# Patient Record
Sex: Female | Born: 1990 | Race: White | Hispanic: No | Marital: Single | Smoking: Never smoker
Health system: Southern US, Community
[De-identification: ages and names within clinical notes are randomized; demographics above are authoritative.]

## PROBLEM LIST (undated history)

## (undated) DIAGNOSIS — N83209 Unspecified ovarian cyst, unspecified side: Secondary | ICD-10-CM

## (undated) HISTORY — PX: NO PAST SURGERIES: SHX2092

## (undated) HISTORY — DX: Unspecified ovarian cyst, unspecified side: N83.209

---

## 2010-11-01 DIAGNOSIS — N83209 Unspecified ovarian cyst, unspecified side: Secondary | ICD-10-CM

## 2010-11-01 HISTORY — DX: Unspecified ovarian cyst, unspecified side: N83.209

## 2014-01-03 LAB — HM HIV SCREENING LAB: HM HIV Screening: NEGATIVE

## 2014-01-03 LAB — HM PAP SMEAR: HM Pap smear: NEGATIVE

## 2015-06-09 DIAGNOSIS — L309 Dermatitis, unspecified: Secondary | ICD-10-CM | POA: Insufficient documentation

## 2015-06-09 DIAGNOSIS — R17 Unspecified jaundice: Secondary | ICD-10-CM | POA: Insufficient documentation

## 2015-06-09 DIAGNOSIS — G43909 Migraine, unspecified, not intractable, without status migrainosus: Secondary | ICD-10-CM | POA: Insufficient documentation

## 2015-06-09 DIAGNOSIS — IMO0001 Reserved for inherently not codable concepts without codable children: Secondary | ICD-10-CM | POA: Insufficient documentation

## 2015-06-11 ENCOUNTER — Ambulatory Visit
Admission: RE | Admit: 2015-06-11 | Discharge: 2015-06-11 | Disposition: A | Discharge status: Home / Self Care | Payer: Managed Care, Other (non HMO) | Source: Ambulatory Visit | Attending: Physician Assistant | Admitting: Physician Assistant

## 2015-06-11 ENCOUNTER — Ambulatory Visit (INDEPENDENT_AMBULATORY_CARE_PROVIDER_SITE_OTHER): Payer: Managed Care, Other (non HMO) | Admitting: Physician Assistant

## 2015-06-11 ENCOUNTER — Encounter: Payer: Self-pay | Admitting: Physician Assistant

## 2015-06-11 VITALS — BP 88/60 | HR 92 | Temp 97.9°F | Resp 18 | Wt 111.8 lb

## 2015-06-11 DIAGNOSIS — R1030 Lower abdominal pain, unspecified: Secondary | ICD-10-CM | POA: Insufficient documentation

## 2015-06-11 LAB — POCT URINALYSIS DIPSTICK
BILIRUBIN UA: NEGATIVE
Glucose, UA: NEGATIVE
Ketones, UA: NEGATIVE
Leukocytes, UA: NEGATIVE
Nitrite, UA: NEGATIVE
PH UA: 6
Protein, UA: NEGATIVE
RBC UA: NEGATIVE
Spec Grav, UA: >=1.03
Urobilinogen, UA: 0.2

## 2015-06-11 LAB — POCT URINE PREGNANCY: Preg Test, Ur: NEGATIVE

## 2015-06-11 NOTE — Progress Notes (Signed)
Patient: Debbie Kidd Female    DOB: 1991-10-20   24 y.o.   MRN: [id number] Visit Date: 06/11/2015  Today's Provider: Margaretann Loveless, PA-C   Chief Complaint  Patient presents with  . Abdominal Pain   Subjective:    Abdominal Pain This is a new (for a month) problem. The current episode started more than 1 month ago. Onset quality: Only when urinating feels pressure. The problem occurs daily (Bladder sxs). The problem has been unchanged (started to feel better but for the last week has got ten worst.). The pain is located in the suprapubic region (Feels pain when has a bowel also). The pain is at a severity of 3/10. The pain is mild. The quality of the pain is sharp and dull (sharp pain is right around umbilicus). The abdominal pain radiates to the back. Associated symptoms include constipation, diarrhea and flatus. Pertinent negatives include no anorexia, arthralgias, belching, dysuria, fever, frequency, headaches, hematochezia, hematuria, melena, myalgias, nausea, vomiting or weight loss. The pain is aggravated by urination and bowel movement. The pain is relieved by nothing. She has tried nothing for the symptoms. There is no history of abdominal surgery, colon cancer, Crohn's disease, gallstones, GERD, irritable bowel syndrome, pancreatitis, PUD or ulcerative colitis.  Constipation This is a new problem. The current episode started in the past 7 days. The problem is unchanged. Her stool frequency is 1 time per day. The stool is described as pellet like. The patient is on a high fiber diet (whole grain foods). She does not exercise regularly. There has been adequate water intake (32 oz a day). Associated symptoms include abdominal pain, back pain, bloating, diarrhea, difficulty urinating (pressure in the bladder) and flatus. Pertinent negatives include no anorexia, fecal incontinence, fever, hematochezia, hemorrhoids, melena, nausea, rectal pain, vomiting or weight loss. She has tried  laxatives (Probiotics daily) for the symptoms. The treatment provided no relief. There is no history of abdominal surgery or irritable bowel syndrome.       Allergies  Allergen Reactions  . Other Hives    Other reaction(s): Eczema   Previous Medications   No medications on file    Review of Systems  Constitutional: Negative.  Negative for fever, chills and weight loss.  HENT: Negative.   Eyes: Negative.   Respiratory: Positive for cough (a little).   Cardiovascular: Negative.   Gastrointestinal: Positive for abdominal pain, diarrhea, constipation, bloating and flatus. Negative for nausea, vomiting, melena, hematochezia, rectal pain, anorexia and hemorrhoids.  Endocrine: Negative.   Genitourinary: Positive for vaginal discharge (light yellow) and difficulty urinating (pressure in the bladder). Negative for dysuria, urgency, frequency, hematuria and vaginal bleeding.  Musculoskeletal: Positive for back pain. Negative for myalgias and arthralgias.  Skin: Negative.   Allergic/Immunologic: Positive for environmental allergies.  Neurological: Negative.  Negative for headaches.  Hematological: Negative.   Psychiatric/Behavioral: Negative.     Social History  Substance Use Topics  . Smoking status: Never Smoker   . Smokeless tobacco: Never Used  . Alcohol Use: 0.0 oz/week    0 Standard drinks or equivalent per week     Comment: OCCASIONALLY   Objective:   There were no vitals taken for this visit.  Physical Exam  Constitutional: She appears well-developed and well-nourished. No distress.  Abdominal: Soft. Normal appearance. She exhibits no shifting dullness, no distension, no pulsatile liver, no fluid wave, no abdominal bruit, no ascites, no pulsatile midline mass and no mass. Bowel sounds are increased. There is no  hepatosplenomegaly. There is tenderness in the right lower quadrant, periumbilical area, suprapubic area and left lower quadrant. There is no rigidity, no rebound, no  guarding, no CVA tenderness, no tenderness at McBurney's point and negative Murphy's sign.  Skin: She is not diaphoretic.  Vitals reviewed.       Assessment & Plan:     1. Lower abdominal pain UA was negative for UTI and negative for pregnancy. I will have her get a x-ray of her abdomen and check a CBC for infectious cause versus constipation. If x-ray is positive for stool burden she will start MiraLAX daily. If x-ray is negative will consider follow-up and evaluation for possible uterine fibroids or ovarian cysts. We'll get a pelvic ultrasound and transvaginal ultrasound to work this up if x-ray is negative. She does see Dr. Valentino Saxon for her OB\GYN care. May consider follow-up with Dr. Valentino Saxon as well for the pain and further evaluation.  Per her UA, she will does seem to be dehydrated. I did discuss this with her and advised her to increase fluid intake as well.  - POCT Urinalysis Dipstick - CBC with Differential - DG Abd 2 Views; Future - POCT urine pregnancy       Margaretann Loveless, PA-C  [facility]

## 2015-06-11 NOTE — Patient Instructions (Signed)

## 2015-06-12 LAB — CBC WITH DIFFERENTIAL/PLATELET
BASOS: 1 %
Basophils Absolute: 0 10*3/uL (ref 0.0–0.2)
EOS (ABSOLUTE): 0.1 10*3/uL (ref 0.0–0.4)
EOS: 1 %
Hematocrit: 38.3 % (ref 34.0–46.6)
Hemoglobin: 12.5 g/dL (ref 11.1–15.9)
Immature Grans (Abs): 0 10*3/uL (ref 0.0–0.1)
Immature Granulocytes: 0 %
LYMPHS ABS: 3.3 10*3/uL — AB (ref 0.7–3.1)
Lymphs: 45 %
MCH: 30.6 pg (ref 26.6–33.0)
MCHC: 32.6 g/dL (ref 31.5–35.7)
MCV: 94 fL (ref 79–97)
Monocytes Absolute: 0.5 10*3/uL (ref 0.1–0.9)
Monocytes: 7 %
NEUTROS ABS: 3.4 10*3/uL (ref 1.4–7.0)
NEUTROS PCT: 46 %
PLATELETS: 269 10*3/uL (ref 150–379)
RBC: 4.09 x10E6/uL (ref 3.77–5.28)
RDW: 12.1 % — ABNORMAL LOW (ref 12.3–15.4)
WBC: 7.4 10*3/uL (ref 3.4–10.8)

## 2015-06-18 ENCOUNTER — Ambulatory Visit (INDEPENDENT_AMBULATORY_CARE_PROVIDER_SITE_OTHER): Payer: Managed Care, Other (non HMO) | Admitting: Obstetrics and Gynecology

## 2015-06-18 ENCOUNTER — Encounter: Payer: Self-pay | Admitting: Obstetrics and Gynecology

## 2015-06-18 VITALS — BP 101/61 | HR 72 | Resp 16 | Ht 64.0 in | Wt 111.6 lb

## 2015-06-18 DIAGNOSIS — N9489 Other specified conditions associated with female genital organs and menstrual cycle: Secondary | ICD-10-CM

## 2015-06-18 DIAGNOSIS — R102 Pelvic and perineal pain: Secondary | ICD-10-CM

## 2015-06-18 NOTE — Progress Notes (Signed)
GYNECOLOGY PROGRESS NOTE  Subjective:    Patient ID: Debbie Kidd, female    DOB: [birth year], 24 y.o.   MRN: [id number]  HPI  Patient is a 24 y.o. G0P0 female who presents as a referral from [facility] for pelvic pressure ongoing x 1 month.  Reports feeling a "heaviness and excess pressure near bladder and rectum" when going to void or have a BM.  Denies painful stools or urination, or pelvic pain. Had been dealing with constipation earlier last month, however this has resolved, but the pelvic pressure is still there.  Currently has a Mirena IUD in place (placed 12/2013).  Denies any issues with IUD except notes that most recent period was heavier than normal. Has not attempted to feel for strings.   PCP was concerned that patient may have fibroids, and was referred to GYN.  Patient had an abdominal X-ray ~ 3 weeks ago that noted large amounts of stool in colon, possible mild small bowel ilieus.     Past Medical History  Diagnosis Date  . Ovarian cyst 2012    No surgery needed   Past Surgical History  Procedure Laterality Date  . No past surgeries     Social History  Substance Use Topics  . Smoking status: Never Smoker   . Smokeless tobacco: Never Used  . Alcohol Use: 0.0 oz/week    0 Standard drinks or equivalent per week     Comment: OCCASIONALLY    Current Outpatient Prescriptions on File Prior to Visit  Medication Sig Dispense Refill  . levonorgestrel (MIRENA) 20 MCG/24HR IUD 1 each by Intrauterine route once.    . Probiotic Product (PROBIOTIC DAILY) CAPS Take 2 capsules by mouth daily.    Marland Kitchen triamcinolone cream (KENALOG) 0.5 % apply to affected area twice a day  0   No current facility-administered medications on file prior to visit.   Allergies  Allergen Reactions  . Other Hives    Other reaction(s): Eczema    Review of Systems Pertinent items are noted in HPI.   Objective:   Blood pressure 101/61, pulse 72, resp. rate 16, height 5\' 4"  (1.626 m), weight  111 lb 9.6 oz (50.621 kg), last menstrual period 05/16/2015. General appearance: alert and no distress Abdomen: soft, non-tender; bowel sounds normal; no masses,  no organomegaly Pelvic: cervix normal in appearance, external genitalia normal, no adnexal masses or tenderness, no cervical motion tenderness, rectovaginal septum normal, uterus normal size, shape, and consistency and vagina normal without discharge.  IUD strings visible.  Extremities: extremities normal, atraumatic, no cyanosis or edema Neurologic: Grossly normal   Labs:  Office Visit on 06/11/2015  Component Date Value Ref Range Status  . Color, UA 06/11/2015 Yellow   Final  . Clarity, UA 06/11/2015 Clear   Final  . Glucose, UA 06/11/2015 negative   Final  . Bilirubin, UA 06/11/2015 negative   Final  . Ketones, UA 06/11/2015 negative   Final  . Spec Grav, UA 06/11/2015 >=1.030   Final  . Blood, UA 06/11/2015 negative   Final  . pH, UA 06/11/2015 6.0   Final  . Protein, UA 06/11/2015 negative   Final  . Urobilinogen, UA 06/11/2015 0.2   Final  . Nitrite, UA 06/11/2015 negative   Final  . Leukocytes, UA 06/11/2015 Negative  Negative Final  . WBC 06/11/2015 7.4  3.4 - 10.8 x10E3/uL Final  . RBC 06/11/2015 4.09  3.77 - 5.28 x10E6/uL Final  . Hemoglobin 06/11/2015 12.5  11.1 - 15.9 g/dL  Final  . Hematocrit 06/11/2015 38.3  34.0 - 46.6 % Final  . MCV 06/11/2015 94  79 - 97 fL Final  . MCH 06/11/2015 30.6  26.6 - 33.0 pg Final  . MCHC 06/11/2015 32.6  31.5 - 35.7 g/dL Final  . RDW 24/26/8341 12.1* 12.3 - 15.4 % Final  . Platelets 06/11/2015 269  150 - 379 x10E3/uL Final  . Neutrophils 06/11/2015 46   Final  . Lymphs 06/11/2015 45   Final  . Monocytes 06/11/2015 7   Final  . Eos 06/11/2015 1   Final  . Basos 06/11/2015 1   Final  . Neutrophils Absolute 06/11/2015 3.4  1.4 - 7.0 x10E3/uL Final  . Lymphocytes Absolute 06/11/2015 3.3* 0.7 - 3.1 x10E3/uL Final  . Monocytes Absolute 06/11/2015 0.5  0.1 - 0.9 x10E3/uL Final  .  EOS (ABSOLUTE) 06/11/2015 0.1  0.0 - 0.4 x10E3/uL Final  . Basophils Absolute 06/11/2015 0.0  0.0 - 0.2 x10E3/uL Final  . Immature Granulocytes 06/11/2015 0   Final  . Immature Grans (Abs) 06/11/2015 0.0  0.0 - 0.1 x10E3/uL Final  . Preg Test, Ur 06/11/2015 Negative  Negative Final    Assessment:   Pelvic pressure in female  Plan:   Unsure cause of pelvic pressure currently. Occurs mostly when attempting to void or defecate. Denies further issues with constipation.  Normal UA and neg UPT last visit. Will order pelvic sono to assess reproductive organs. Of note, patient has had a h/o ovarian cyst in the past, could have a recurrence.  Could also possibly be due to IUD positioning.  F/u in 1 week with ultrasound.  To notify patient of results by phone.    Hildred Laser, MD [facility]

## 2015-06-24 ENCOUNTER — Ambulatory Visit: Payer: Managed Care, Other (non HMO)

## 2015-06-24 DIAGNOSIS — R102 Pelvic and perineal pain: Secondary | ICD-10-CM

## 2015-06-24 DIAGNOSIS — N9489 Other specified conditions associated with female genital organs and menstrual cycle: Secondary | ICD-10-CM | POA: Diagnosis not present

## 2015-06-27 ENCOUNTER — Telehealth: Payer: Self-pay | Admitting: Obstetrics and Gynecology

## 2015-06-27 NOTE — Telephone Encounter (Signed)
Pt notified of [place] results: normal.

## 2015-06-27 NOTE — Telephone Encounter (Signed)
Pt called for [place] results she had on Tuesday!!!

## 2015-07-15 ENCOUNTER — Telehealth: Payer: Self-pay

## 2015-07-15 NOTE — Telephone Encounter (Signed)
Per Dr.Cherry informed pt that her pelvic u/s was normal, IUD is in the correct position. Pt states that her pain and pressure have resolved, and also notes that she did not have a period this past month.

## 2015-11-05 ENCOUNTER — Ambulatory Visit (INDEPENDENT_AMBULATORY_CARE_PROVIDER_SITE_OTHER): Payer: Managed Care, Other (non HMO) | Admitting: Physician Assistant

## 2015-11-05 ENCOUNTER — Encounter: Payer: Self-pay | Admitting: Physician Assistant

## 2015-11-05 VITALS — BP 102/70 | HR 64 | Temp 98.3°F | Resp 14 | Wt 116.4 lb

## 2015-11-05 DIAGNOSIS — F3281 Premenstrual dysphoric disorder: Secondary | ICD-10-CM | POA: Diagnosis not present

## 2015-11-05 DIAGNOSIS — F410 Panic disorder [episodic paroxysmal anxiety] without agoraphobia: Secondary | ICD-10-CM | POA: Diagnosis not present

## 2015-11-05 MED ORDER — ALPRAZOLAM 0.25 MG PO TABS: 0.2500 mg | ORAL_TABLET | Freq: Three times a day (TID) | ORAL | Status: DC | PRN

## 2015-11-05 MED ORDER — ESCITALOPRAM OXALATE 10 MG PO TABS: ORAL_TABLET | ORAL | Status: DC

## 2015-11-05 NOTE — Patient Instructions (Signed)
Premenstrual Syndrome Premenstrual syndrome (PMS) is a condition that consists of physical, emotional, and behavioral symptoms that affect women of childbearing age. PMS occurs 5-14 days before the start of a menstrual period and often recurs in a predictable pattern. The symptoms go away a few days after the menstrual period starts. PMS can interfere in many ways with normal daily activities and can range from mild to severe. When PMS is considered severe, it Hornik be diagnosed as premenstrual dysphoric disorder (PMDD). A small percentage of women are affected by PMS symptoms and an even smaller percentage of those women are affected by PMDD.  CAUSES  The exact cause of PMS is unknown, but it seems to be related to cyclic hormone changes that happen before menstruation. These hormones are thought to affect chemicals in the brain (serotonin) that can influence a person's mood.  SYMPTOMS  Symptoms of PMS recur consistently from month to month and go away completely after the menstrual period starts. The most common emotional or behavioral symptom is mood swings. These mood swings can be disabling and interfere with normal activities of daily living. Other common symptoms include depression and angry outbursts. Other symptoms Ishler include:   Irritability.  Anxiety.  Crying spells.   Food cravings or appetite changes.   Changes in sexual desire.   Confusion.   Aggression.   Social withdrawal.   Poor concentration. The most common physical symptoms include a sense of bloating, breast pain, headaches, and extreme fatigue. Other physical symptoms include:   Backaches.   Swelling of the hands and feet.   Weight gain.   Hot flashes.  DIAGNOSIS  To make a diagnosis, your caregiver will ask questions to confirm that you are having a pattern of symptoms. Symptoms must:   Be present 5 days before the start of your period and be present at least 3 months in a row.   End within 4 days  after your period starts.   Interfere with some of your normal activities.  Other conditions, such as thyroid disease, depression, and migraine headaches must be ruled out before a diagnosis of PMS is confirmed.  TREATMENT  Your caregiver Mungo suggest ways to maintain a healthy lifestyle, such as exercise. Over-the-counter pain relievers Blower ease cramps, aches, pains, headaches, and breast tenderness. However, selective serotonin reuptake inhibitors (SSRIs) are medicines that are most beneficial in improving PMS if taken in the second half of the monthly cycle. They Janus be taken on a daily basis. The most effective oral contraceptive pill used for symptoms of PMS is one that contains the ingredient drospirenone. Taking 4 days off of the pill instead of the usual 7 days also has shown to increase effectiveness.  There are a number of drugs, dietary supplements, vitamins, and water pills (diuretics) which have been suggested to be helpful but have not shown to be of any benefit to improving PMS symptoms.  HOME CARE INSTRUCTIONS   For 2-3 months, write down your symptoms, their severity, and how long they last. This Thornell help your caregiver prescribe the best treatment for your symptoms.  Exercise regularly as suggested by your caregiver.  Eat a regular, well-balanced diet.  Avoid caffeine, alcohol, and tobacco consumption.  Limit salt and salty foods to lessen bloating and fluid retention.  Get enough sleep. Practice relaxation techniques.  Drink enough fluids to keep your urine clear or pale yellow.  Take medicines as directed by your caregiver.  Limit stress.  Take a multivitamin as directed by your   caregiver.   This information is not intended to replace advice given to you by your health care provider. Make sure you discuss any questions you have with your health care provider.   Document Released: 10/15/2000 Document Revised: 07/12/2012 Document Reviewed: 03/06/2012 Elsevier  Interactive Patient Education 2016 [employer].  Escitalopram tablets What is this medicine? ESCITALOPRAM (es sye TAL oh pram) is used to treat depression and certain types of anxiety. This medicine may be used for other purposes; ask your health care provider or pharmacist if you have questions. What should I tell my health care provider before I take this medicine? They need to know if you have any of these conditions: -bipolar disorder or a family history of bipolar disorder -diabetes -glaucoma -heart disease -kidney or liver disease -receiving electroconvulsive therapy -seizures (convulsions) -suicidal thoughts, plans, or attempt by you or a family member -an unusual or allergic reaction to escitalopram, the related drug citalopram, other medicines, foods, dyes, or preservatives -pregnant or trying to become pregnant -breast-feeding How should I use this medicine? Take this medicine by mouth with a glass of water. Follow the directions on the prescription label. You can take it with or without food. If it upsets your stomach, take it with food. Take your medicine at regular intervals. Do not take it more often than directed. Do not stop taking this medicine suddenly except upon the advice of your doctor. Stopping this medicine too quickly may cause serious side effects or your condition may worsen. A special MedGuide will be given to you by the pharmacist with each prescription and refill. Be sure to read this information carefully each time. Talk to your pediatrician regarding the use of this medicine in children. Special care may be needed. Overdosage: If you think you have taken too much of this medicine contact a poison control center or emergency room at once. NOTE: This medicine is only for you. Do not share this medicine with others. What if I miss a dose? If you miss a dose, take it as soon as you can. If it is almost time for your next dose, take only that dose. Do not take  double or extra doses. What may interact with this medicine? Do not take this medicine with any of the following medications: -certain medicines for fungal infections like fluconazole, itraconazole, ketoconazole, posaconazole, voriconazole -cisapride -citalopram -dofetilide -dronedarone -linezolid -MAOIs like Carbex, Eldepryl, Marplan, Nardil, and Parnate -methylene blue (injected into a vein) -pimozide -thioridazine -ziprasidone This medicine may also interact with the following medications: -alcohol -aspirin and aspirin-like medicines -carbamazepine -certain medicines for depression, anxiety, or psychotic disturbances -certain medicines for migraine headache like almotriptan, eletriptan, frovatriptan, naratriptan, rizatriptan, sumatriptan, zolmitriptan -certain medicines for sleep -certain medicines that treat or prevent blood clots like warfarin, enoxaparin, dalteparin -cimetidine -diuretics -fentanyl -furazolidone -isoniazid -lithium -metoprolol -NSAIDs, medicines for pain and inflammation, like ibuprofen or naproxen -other medicines that prolong the QT interval (cause an abnormal heart rhythm) -procarbazine -rasagiline -supplements like St. John's wort, kava kava, valerian -tramadol -tryptophan This list may not describe all possible interactions. Give your health care provider a list of all the medicines, herbs, non-prescription drugs, or dietary supplements you use. Also tell them if you smoke, drink alcohol, or use illegal drugs. Some items may interact with your medicine. What should I watch for while using this medicine? Tell your doctor if your symptoms do not get better or if they get worse. Visit your doctor or health care professional for regular checks on your progress.  Because it may take several weeks to see the full effects of this medicine, it is important to continue your treatment as prescribed by your doctor. Patients and their families should watch out  for new or worsening thoughts of suicide or depression. Also watch out for sudden changes in feelings such as feeling anxious, agitated, panicky, irritable, hostile, aggressive, impulsive, severely restless, overly excited and hyperactive, or not being able to sleep. If this happens, especially at the beginning of treatment or after a change in dose, call your health care professional. Bonita Quin may get drowsy or dizzy. Do not drive, use machinery, or do anything that needs mental alertness until you know how this medicine affects you. Do not stand or sit up quickly, especially if you are an older patient. This reduces the risk of dizzy or fainting spells. Alcohol may interfere with the effect of this medicine. Avoid alcoholic drinks. Your mouth may get dry. Chewing sugarless gum or sucking hard candy, and drinking plenty of water may help. Contact your doctor if the problem does not go away or is severe. What side effects may I notice from receiving this medicine? Side effects that you should report to your doctor or health care professional as soon as possible: -allergic reactions like skin rash, itching or hives, swelling of the face, lips, or tongue -confusion -feeling faint or lightheaded, falls -fast talking and excited feelings or actions that are out of control -hallucination, loss of contact with reality -seizures -suicidal thoughts or other mood changes -unusual bleeding or bruising Side effects that usually do not require medical attention (report to your doctor or health care professional if they continue or are bothersome): -blurred vision -changes in appetite -change in sex drive or performance -headache -increased sweating -nausea This list may not describe all possible side effects. Call your doctor for medical advice about side effects. You may report side effects to FDA at 1-800-FDA-1088. Where should I keep my medicine? Keep out of reach of children. Store at room temperature  between 15 and 30 degrees C (59 and 86 degrees F). Throw away any unused medicine after the expiration date. NOTE: This sheet is a summary. It may not cover all possible information. If you have questions about this medicine, talk to your doctor, pharmacist, or health care provider.    2016, Elsevier/Gold Standard. (2013-05-15 12:32:55)  Alprazolam tablets What is this medicine? ALPRAZOLAM (al PRAY zoe lam) is a benzodiazepine. It is used to treat anxiety and panic attacks. This medicine may be used for other purposes; ask your health care provider or pharmacist if you have questions. What should I tell my health care provider before I take this medicine? They need to know if you have any of these conditions: -an alcohol or drug abuse problem -bipolar disorder, depression, psychosis or other mental health conditions -glaucoma -kidney or liver disease -lung or breathing disease -myasthenia gravis -Parkinson's disease -porphyria -seizures or a history of seizures -suicidal thoughts -an unusual or allergic reaction to alprazolam, other benzodiazepines, foods, dyes, or preservatives -pregnant or trying to get pregnant -breast-feeding How should I use this medicine? Take this medicine by mouth with a glass of water. Follow the directions on the prescription label. Take your medicine at regular intervals. Do not take it more often than directed. If you have been taking this medicine regularly for some time, do not suddenly stop taking it. You must gradually reduce the dose or you may get severe side effects. Ask your doctor or health care professional  for advice. Even after you stop taking this medicine it can still affect your body for several days. Talk to your pediatrician regarding the use of this medicine in children. Special care may be needed. Overdosage: If you think you have taken too much of this medicine contact a poison control center or emergency room at once. NOTE: This medicine  is only for you. Do not share this medicine with others. What if I miss a dose? If you miss a dose, take it as soon as you can. If it is almost time for your next dose, take only that dose. Do not take double or extra doses. What may interact with this medicine? Do not take this medicine with any of the following medications: -certain medicines for HIV infection or AIDS -ketoconazole -itraconazole This medicine may also interact with the following medications: -birth control pills -certain macrolide antibiotics like clarithromycin, erythromycin, troleandomycin -cimetidine -cyclosporine -ergotamine -grapefruit juice -herbal or dietary supplements like kava kava, melatonin, dehydroepiandrosterone, DHEA, St. John's Wort or valerian -imatinib, STI-571 -isoniazid -levodopa -medicines for depression, anxiety, or psychotic disturbances -prescription pain medicines -rifampin, rifapentine, or rifabutin -some medicines for blood pressure or heart problems -some medicines for seizures like carbamazepine, oxcarbazepine, phenobarbital, phenytoin, primidone This list may not describe all possible interactions. Give your health care provider a list of all the medicines, herbs, non-prescription drugs, or dietary supplements you use. Also tell them if you smoke, drink alcohol, or use illegal drugs. Some items may interact with your medicine. What should I watch for while using this medicine? Visit your doctor or health care professional for regular checks on your progress. Your body can become dependent on this medicine. Ask your doctor or health care professional if you still need to take it. You may get drowsy or dizzy. Do not drive, use machinery, or do anything that needs mental alertness until you know how this medicine affects you. To reduce the risk of dizzy and fainting spells, do not stand or sit up quickly, especially if you are an older patient. Alcohol may increase dizziness and drowsiness.  Avoid alcoholic drinks. Do not treat yourself for coughs, colds or allergies without asking your doctor or health care professional for advice. Some ingredients can increase possible side effects. What side effects may I notice from receiving this medicine? Side effects that you should report to your doctor or health care professional as soon as possible: -allergic reactions like skin rash, itching or hives, swelling of the face, lips, or tongue -confusion, forgetfulness -depression -difficulty sleeping -difficulty speaking -feeling faint or lightheaded, falls -mood changes, excitability or aggressive behavior -muscle cramps -trouble passing urine or change in the amount of urine -unusually weak or tired Side effects that usually do not require medical attention (report to your doctor or health care professional if they continue or are bothersome): -change in sex drive or performance -changes in appetite This list may not describe all possible side effects. Call your doctor for medical advice about side effects. You may report side effects to FDA at 1-800-FDA-1088. Where should I keep my medicine? Keep out of the reach of children. This medicine can be abused. Keep your medicine in a safe place to protect it from theft. Do not share this medicine with anyone. Selling or giving away this medicine is dangerous and against the law. Store at room temperature between 20 and 25 degrees C (68 and 77 degrees F). This medicine may cause accidental overdose and death if taken by other adults, children, or  pets. Mix any unused medicine with a substance like cat litter or coffee grounds. Then throw the medicine away in a sealed container like a sealed bag or a coffee can with a lid. Do not use the medicine after the expiration date. NOTE: This sheet is a summary. It may not cover all possible information. If you have questions about this medicine, talk to your doctor, pharmacist, or health care provider.     2016, Elsevier/Gold Standard. (2014-07-09 14:51:36)

## 2015-11-05 NOTE — Progress Notes (Signed)
Patient ID: Debbie Kidd, female   DOB: 08/24/1991, 25 y.o.   MRN: [id number]   Patient: Debbie Kidd Female    DOB: January 10, 1991   24 y.o.   MRN: [id number] Visit Date: 11/05/2015  Today's Provider: Margaretann Loveless, PA-C   Chief Complaint  Patient presents with  . Anxiety   Subjective:    Anxiety Presents for initial visit. Onset was 1 to 6 months ago. The problem has been unchanged. Symptoms include depressed mood, excessive worry, hyperventilation, insomnia, irritability, nervous/anxious behavior, palpitations, panic, restlessness and shortness of breath. Patient reports no suicidal ideas. Episode frequency: episodes. The most recent episode lasted 7 days. The symptoms are aggravated by work stress, caffeine and family issues. The quality of sleep is good.   Past treatments include nothing.   she states her biggest issue is being tear away from her family and not having anyone that she can really talk to her hangout with. She states she feels alone and that work has started causing her stress. She states that she and her mother have both noticed that the panic attacks always occur the week prior to her menstrual cycle starting.    Previous Medications   LEVONORGESTREL (MIRENA) 20 MCG/24HR IUD    1 each by Intrauterine route once.   PROBIOTIC PRODUCT (PROBIOTIC DAILY) CAPS    Take 2 capsules by mouth daily.   TRIAMCINOLONE CREAM (KENALOG) 0.5 %    apply to affected area twice a day    Review of Systems  Constitutional: Positive for irritability.  HENT: Negative.   Eyes: Negative.   Respiratory: Positive for shortness of breath.   Cardiovascular: Positive for palpitations.  Gastrointestinal: Negative.   Endocrine: Negative.   Genitourinary: Negative.   Musculoskeletal: Negative.   Skin: Negative.   Allergic/Immunologic: Negative.   Neurological: Negative.   Hematological: Negative.   Psychiatric/Behavioral: Positive for sleep disturbance and dysphoric mood. Negative for suicidal  ideas and self-injury. The patient is nervous/anxious and has insomnia.     Social History  Substance Use Topics  . Smoking status: Never Smoker   . Smokeless tobacco: Never Used  . Alcohol Use: 0.0 oz/week    0 Standard drinks or equivalent per week     Comment: OCCASIONALLY   Objective:   BP 102/70 mmHg  Pulse 64  Temp(Src) 98.3 F (36.8 C) (Oral)  Resp 14  Wt 116 lb 6.4 oz (52.799 kg)  LMP 11/05/2015  Physical Exam  Constitutional: She appears well-developed and well-nourished. No distress.  Neck: Normal range of motion. Neck supple. No JVD present. No tracheal deviation present. No thyromegaly present.  Cardiovascular: Normal rate, regular rhythm and normal heart sounds.  Exam reveals no gallop and no friction rub.   No murmur heard. Pulmonary/Chest: Effort normal and breath sounds normal. No respiratory distress. She has no wheezes. She has no rales.  Lymphadenopathy:    She has no cervical adenopathy.  Skin: She is not diaphoretic.  Psychiatric: She has a normal mood and affect. Her speech is normal and behavior is normal. Judgment and thought content normal. Cognition and memory are normal.  Vitals reviewed.       Assessment & Plan:     1. PMDD (premenstrual dysphoric disorder) Worsening symptoms. She already has IUD in place. We will begin Lexapro and Xanax as below. We will titrate the Lexapro up slowly to hopefully avoid any adverse reactions. She is to call the office if she has any adverse reactions, worsening depression, suicidal ideations. I will see  her back in 4 weeks to evaluate how she is doing with the medication changes if she tolerates these well. - escitalopram (LEXAPRO) 10 MG tablet; Take 1/2 tab PO q h.s. X 1 week, then 1 tab PO q h.s.  Dispense: 30 tablet; Refill: 1 - ALPRAZolam (XANAX) 0.25 MG tablet; Take 1 tablet (0.25 mg total) by mouth 3 (three) times daily as needed for anxiety.  Dispense: 60 tablet; Refill: 1  2. Panic attacks Worsening. Will  give Xanax as below. She is to call the office if symptoms worsen. I will see her back in 4 weeks to see how she is doing with the medication. - ALPRAZolam (XANAX) 0.25 MG tablet; Take 1 tablet (0.25 mg total) by mouth 3 (three) times daily as needed for anxiety.  Dispense: 60 tablet; Refill: 1   Follow up: No Follow-up on file.

## 2015-11-20 ENCOUNTER — Telehealth: Payer: Self-pay

## 2015-11-20 NOTE — Telephone Encounter (Signed)
Pt states she has taken the Rx ALPRAZolam (XANAX) 0.25 MG tablet but it is making her very sleepy.  Pt is requesting a call back.  AV#[phone]/[phone]

## 2015-11-20 NOTE — Telephone Encounter (Signed)
Debbie Kidd called back stating she is having another panic attack and what should she do.  Thanks,  -Styles Fambro

## 2015-11-20 NOTE — Telephone Encounter (Signed)
Spoke with Debbie Kidd. She states she took the Xanax and with the Xanax she is not having the panic attack as she was yesterday on when she called. But the Xanax cause her to fall asleep, the reason she was able to answer me was because she put the phone volume up high.Patient in the previous message asked if she was able to take the medicine at work? Told patient I was going to send this message to Antony Contras if she doesn't get another panic attack by tomorrow to don't take the xanax if she does get a panic attack and is at home to take it xanax 0.25 until I get back to her depending on what will be the next plan. Patient agree. Dr. Sherrie Mustache Informed.  Thanks,  -Heidi Maclin

## 2015-11-20 NOTE — Telephone Encounter (Signed)
Debbie Kidd called concern about having panic attacks since yesterday. Patient wanted to know what to do because is not able to function properly, she is shaking, breathing fast, having hyperventilation. Didn't know if she should go to the emergency room for this. Asked the patient if she took her Xanax patient stated that has not been taking the Xanax because she was scare to take the medicine. Told the patient that Antony Contras was treating her Panic attacks with the Xanax and that she was able to take it 3 times a day as needed.Spoke with doctor Sherrie Mustache and he advised for patient to take the Xanax 0.25 and call us back in 30 minutes to see how she is doing.  Thanks,  -Joseline

## 2015-11-20 NOTE — Telephone Encounter (Signed)
Called patient back and told her that Dr.Fisher advised is to take another Xanax or go the Emergency room. Per patient will take another Xanax and will go from there.  Thanks,  -Idara Woodside

## 2015-12-03 ENCOUNTER — Ambulatory Visit: Payer: Managed Care, Other (non HMO) | Admitting: Physician Assistant

## 2015-12-16 IMAGING — CR DG ABDOMEN 2V
1 series · 2 of 2 positions shown · non-contrast
Comparison: None.

CLINICAL DATA: Abdominal pain.

EXAM:
ABDOMEN - 2 VIEW

[Series 1: dg abd 2 views · 0.14mm/px · 2 of 2 slices shown]
[im 1/2]
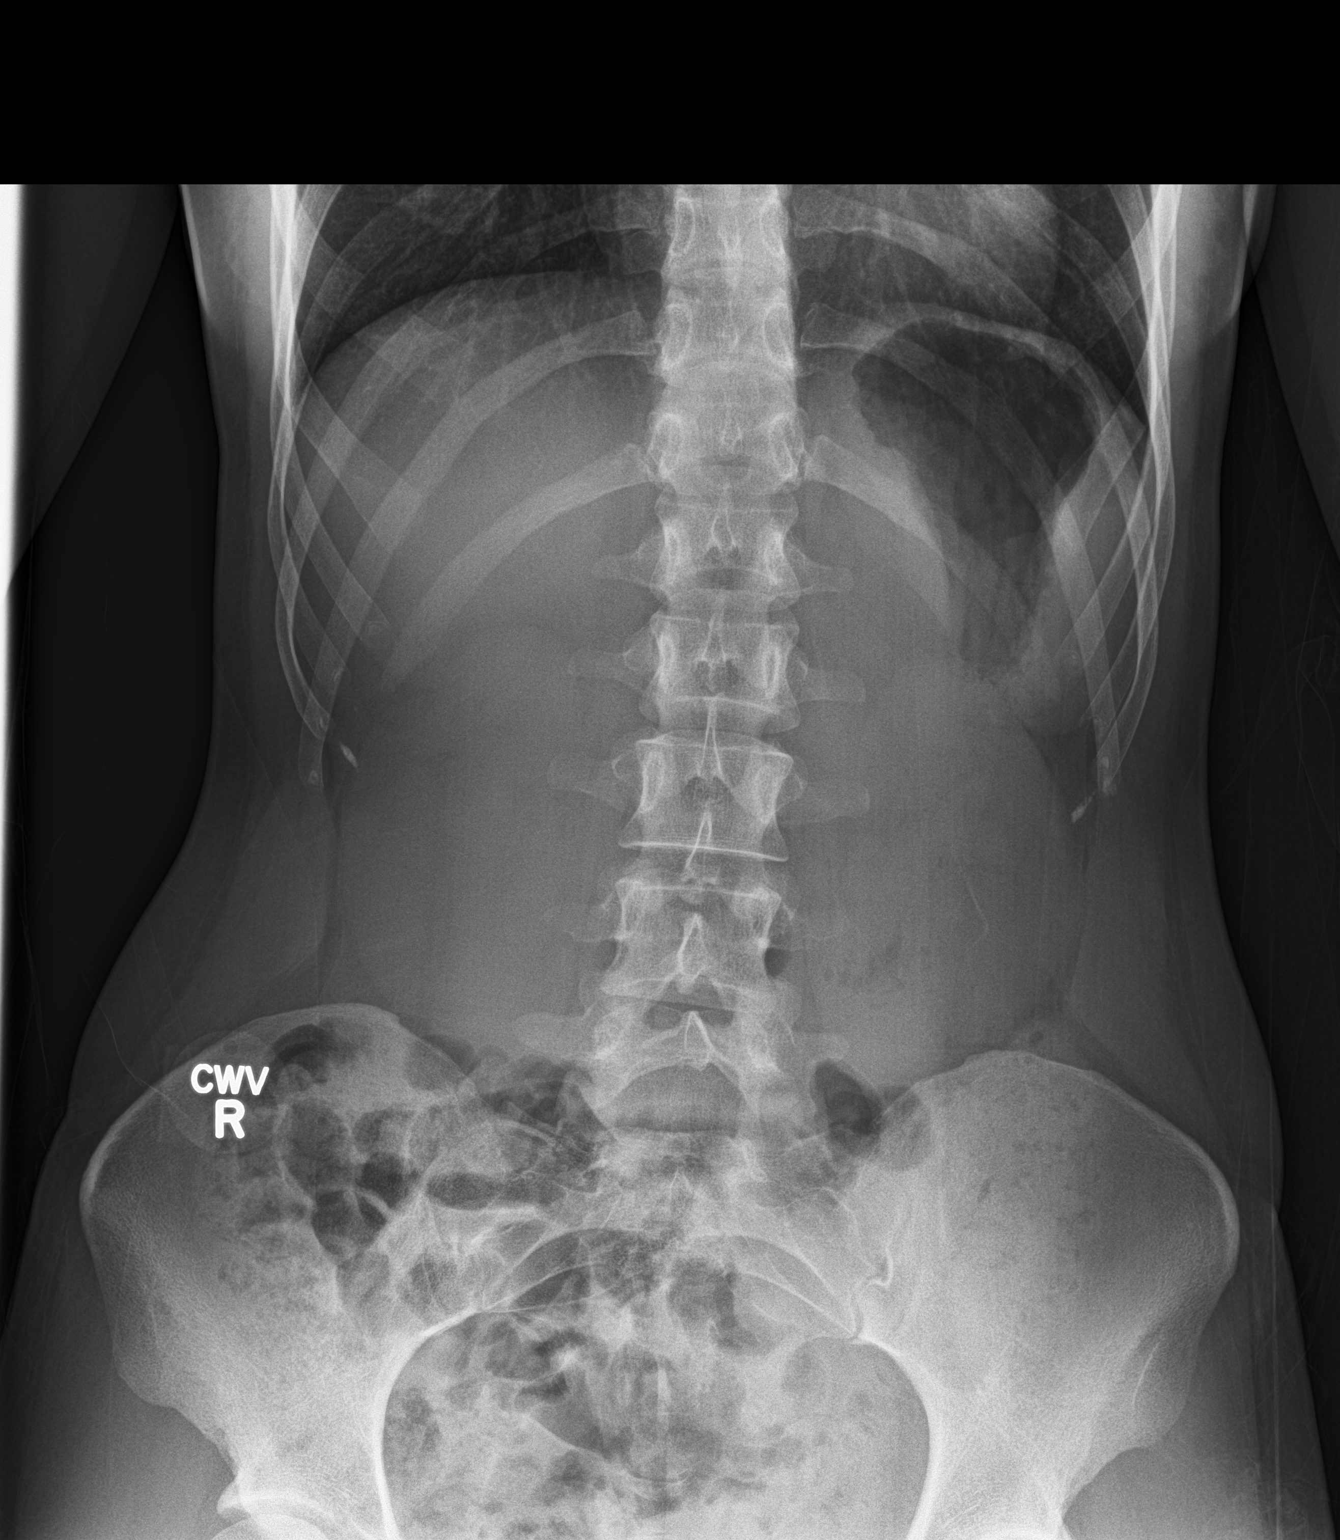
[im 2/2]
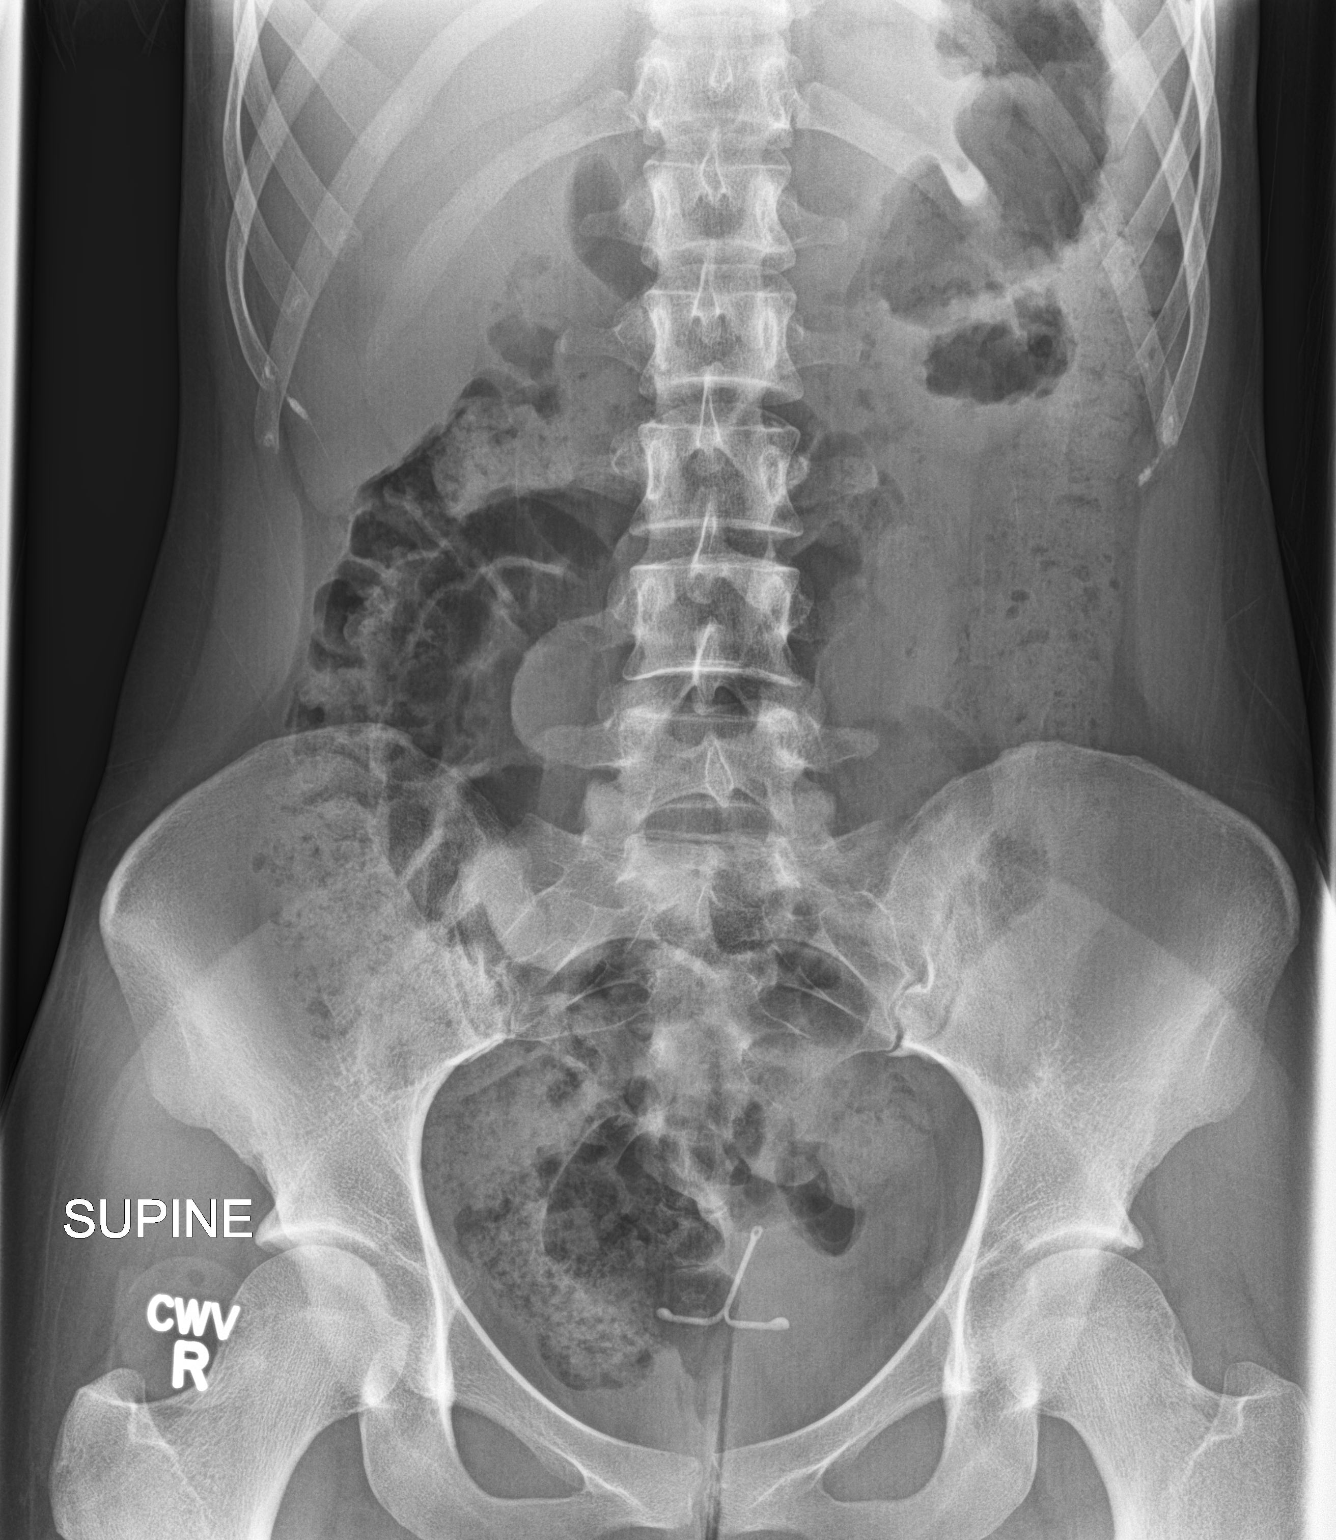

[2 of 2 positions shown; findings below may reference images not displayed]

FINDINGS: Soft tissue structures are unremarkable. Several slightly distended
air-filled loops of small bowel noted. Large amount of stool noted
in the colon. No free air. No acute bony abnormality. IUD noted in
the pelvis.
IMPRESSION: Several loops of slightly distended air-filled loops of small bowel
noted. Mild adynamic ileus cannot be completely excluded. Large
amount of stool is noted throughout the colon. Constipation cannot
be excluded.

## 2016-01-08 ENCOUNTER — Ambulatory Visit (INDEPENDENT_AMBULATORY_CARE_PROVIDER_SITE_OTHER): Payer: Managed Care, Other (non HMO) | Admitting: Physician Assistant

## 2016-01-08 ENCOUNTER — Encounter: Payer: Self-pay | Admitting: Physician Assistant

## 2016-01-08 VITALS — BP 94/60 | HR 88 | Temp 98.6°F | Resp 16 | Wt 116.6 lb

## 2016-01-08 DIAGNOSIS — F3281 Premenstrual dysphoric disorder: Secondary | ICD-10-CM

## 2016-01-08 MED ORDER — ESCITALOPRAM OXALATE 20 MG PO TABS: 20.0000 mg | ORAL_TABLET | Freq: Every day | ORAL | Status: DC

## 2016-01-08 NOTE — Progress Notes (Signed)
Patient: Debbie Kidd Female    DOB: 1991/09/24   25 y.o.   MRN: [id number] Visit Date: 01/08/2016  Today's Provider: Margaretann Loveless, PA-C   Chief Complaint  Patient presents with  . Follow-up    Anxiety and PMDD   Subjective:    HPI Anxiety: Patient here to follow-up anxiety disorder. She has the following symptoms: difficulty concentrating. She gets the panic attacks are once a month. She had a panic attack last Wednesday with SOB, shaking, and feeling like losing control. She took her Xanax once in February and states that when she took it it put her to sleep and after waking up she was feeling the same. She carries the Xanax with her just in case but has not taken it. PMDD: She is currently taking Lexapro and is seeing a psychologist once a week. Per patient her psychologist believes she has OCD and thinks patient can benefit from her Lexapro if increase. She is not having depressed mood, fatigue and racing thoughts currently, however, these do happen when she is on her menstrual cycle. No suicidal ideas.    Allergies  Allergen Reactions  . Other Hives    Other reaction(s): Eczema   Previous Medications   ALPRAZOLAM (XANAX) 0.25 MG TABLET    Take 1 tablet (0.25 mg total) by mouth 3 (three) times daily as needed for anxiety.   ESCITALOPRAM (LEXAPRO) 10 MG TABLET    Take 1/2 tab PO q h.s. X 1 week, then 1 tab PO q h.s.   LEVONORGESTREL (MIRENA) 20 MCG/24HR IUD    1 each by Intrauterine route once.   PROBIOTIC PRODUCT (PROBIOTIC DAILY) CAPS    Take 2 capsules by mouth daily. Reported on 01/08/2016   TRIAMCINOLONE CREAM (KENALOG) 0.5 %    Reported on 01/08/2016    Review of Systems  Constitutional: Negative for fatigue.  Respiratory: Negative for choking (only with the panic attacks "like pressure on the chest"), shortness of breath and wheezing.   Cardiovascular: Negative for chest pain and palpitations.  Gastrointestinal: Negative.   Genitourinary: Negative.     Musculoskeletal: Negative.   Neurological: Negative for dizziness and headaches.  Psychiatric/Behavioral: Positive for dysphoric mood and decreased concentration. Negative for suicidal ideas, hallucinations, sleep disturbance, self-injury and agitation. The patient is nervous/anxious. The patient is not hyperactive.     Social History  Substance Use Topics  . Smoking status: Never Smoker   . Smokeless tobacco: Never Used  . Alcohol Use: 0.0 oz/week    0 Standard drinks or equivalent per week     Comment: OCCASIONALLY   Objective:   BP 94/60 mmHg  Pulse 88  Temp(Src) 98.6 F (37 C) (Oral)  Resp 16  Wt 116 lb 9.6 oz (52.889 kg)  LMP 01/08/2016  Physical Exam  Constitutional: She appears well-developed and well-nourished. No distress.  Cardiovascular: Normal rate, regular rhythm and normal heart sounds.  Exam reveals no gallop and no friction rub.   No murmur heard. Pulmonary/Chest: Effort normal and breath sounds normal. No respiratory distress. She has no wheezes. She has no rales.  Skin: She is not diaphoretic.  Psychiatric: She has a normal mood and affect. Her behavior is normal. Judgment and thought content normal.  Vitals reviewed.       Assessment & Plan:     1. PMDD (premenstrual dysphoric disorder) Currently fairly stable.  Will increase lexapro to  to see if better benefit for PMDD symptoms and panic attacks that occur  during the week of her menses.  Also advised to cut the xanax in half when needed for panic attacks to see if that helps relieve the panic attack without making her so sleepy. She agrees.  Since she is seeing psychology weekly and currently doing well I will see her back in 3 months. She is to call the office in the meantime if symptoms worsen. - escitalopram (LEXAPRO) 20 MG tablet; Take 1 tablet (20 mg total) by mouth daily.  Dispense: 30 tablet; Refill: 3       Margaretann LovelessJennifer M Boleslaw Borghi, PA-C  [facility]

## 2016-01-08 NOTE — Patient Instructions (Signed)
Premenstrual Syndrome Premenstrual syndrome (PMS) is a condition that consists of physical, emotional, and behavioral symptoms that affect women of childbearing age. PMS occurs 5-14 days before the start of a menstrual period and often recurs in a predictable pattern. The symptoms go away a few days after the menstrual period starts. PMS can interfere in many ways with normal daily activities and can range from mild to severe. When PMS is considered severe, it may be diagnosed as premenstrual dysphoric disorder (PMDD). A small percentage of women are affected by PMS symptoms and an even smaller percentage of those women are affected by PMDD.  CAUSES  The exact cause of PMS is unknown, but it seems to be related to cyclic hormone changes that happen before menstruation. These hormones are thought to affect chemicals in the brain (serotonin) that can influence a person's mood.  SYMPTOMS  Symptoms of PMS recur consistently from month to month and go away completely after the menstrual period starts. The most common emotional or behavioral symptom is mood swings. These mood swings can be disabling and interfere with normal activities of daily living. Other common symptoms include depression and angry outbursts. Other symptoms may include:   Irritability.  Anxiety.  Crying spells.   Food cravings or appetite changes.   Changes in sexual desire.   Confusion.   Aggression.   Social withdrawal.   Poor concentration. The most common physical symptoms include a sense of bloating, breast pain, headaches, and extreme fatigue. Other physical symptoms include:   Backaches.   Swelling of the hands and feet.   Weight gain.   Hot flashes.  DIAGNOSIS  To make a diagnosis, your caregiver will ask questions to confirm that you are having a pattern of symptoms. Symptoms must:   Be present 5 days before the start of your period and be present at least 3 months in a row.   End within 4 days  after your period starts.   Interfere with some of your normal activities.  Other conditions, such as thyroid disease, depression, and migraine headaches must be ruled out before a diagnosis of PMS is confirmed.  TREATMENT  Your caregiver may suggest ways to maintain a healthy lifestyle, such as exercise. Over-the-counter pain relievers may ease cramps, aches, pains, headaches, and breast tenderness. However, selective serotonin reuptake inhibitors (SSRIs) are medicines that are most beneficial in improving PMS if taken in the second half of the monthly cycle. They may be taken on a daily basis. The most effective oral contraceptive pill used for symptoms of PMS is one that contains the ingredient drospirenone. Taking 4 days off of the pill instead of the usual 7 days also has shown to increase effectiveness.  There are a number of drugs, dietary supplements, vitamins, and water pills (diuretics) which have been suggested to be helpful but have not shown to be of any benefit to improving PMS symptoms.  HOME CARE INSTRUCTIONS   For 2-3 months, write down your symptoms, their severity, and how long they last. This may help your caregiver prescribe the best treatment for your symptoms.  Exercise regularly as suggested by your caregiver.  Eat a regular, well-balanced diet.  Avoid caffeine, alcohol, and tobacco consumption.  Limit salt and salty foods to lessen bloating and fluid retention.  Get enough sleep. Practice relaxation techniques.  Drink enough fluids to keep your urine clear or pale yellow.  Take medicines as directed by your caregiver.  Limit stress.  Take a multivitamin as directed by your   caregiver.   This information is not intended to replace advice given to you by your health care provider. Make sure you discuss any questions you have with your health care provider.   Document Released: 10/15/2000 Document Revised: 07/12/2012 Document Reviewed: 03/06/2012 Elsevier  Interactive Patient Education 2016 Elsevier Inc.  

## 2016-02-03 ENCOUNTER — Encounter: Payer: Self-pay | Admitting: Obstetrics and Gynecology

## 2016-04-09 ENCOUNTER — Ambulatory Visit (INDEPENDENT_AMBULATORY_CARE_PROVIDER_SITE_OTHER): Payer: Managed Care, Other (non HMO) | Admitting: Physician Assistant

## 2016-04-09 ENCOUNTER — Encounter: Payer: Self-pay | Admitting: Physician Assistant

## 2016-04-09 VITALS — BP 90/58 | HR 75 | Temp 98.4°F | Resp 16 | Wt 122.4 lb

## 2016-04-09 DIAGNOSIS — F3281 Premenstrual dysphoric disorder: Secondary | ICD-10-CM

## 2016-04-09 MED ORDER — ESCITALOPRAM OXALATE 20 MG PO TABS
20.0000 mg | ORAL_TABLET | Freq: Every day | ORAL | Status: DC
Start: 2016-04-09 — End: 2016-07-12

## 2016-04-09 NOTE — Patient Instructions (Signed)
Premenstrual Syndrome Premenstrual syndrome (PMS) is a condition that consists of physical, emotional, and behavioral symptoms that affect women of childbearing age. PMS occurs 5-14 days before the start of a menstrual period and often recurs in a predictable pattern. The symptoms go away a few days after the menstrual period starts. PMS can interfere in many ways with normal daily activities and can range from mild to severe. When PMS is considered severe, it may be diagnosed as premenstrual dysphoric disorder (PMDD). A small percentage of women are affected by PMS symptoms and an even smaller percentage of those women are affected by PMDD.  CAUSES  The exact cause of PMS is unknown, but it seems to be related to cyclic hormone changes that happen before menstruation. These hormones are thought to affect chemicals in the brain (serotonin) that can influence a person's mood.  SYMPTOMS  Symptoms of PMS recur consistently from month to month and go away completely after the menstrual period starts. The most common emotional or behavioral symptom is mood swings. These mood swings can be disabling and interfere with normal activities of daily living. Other common symptoms include depression and angry outbursts. Other symptoms may include:   Irritability.  Anxiety.  Crying spells.   Food cravings or appetite changes.   Changes in sexual desire.   Confusion.   Aggression.   Social withdrawal.   Poor concentration. The most common physical symptoms include a sense of bloating, breast pain, headaches, and extreme fatigue. Other physical symptoms include:   Backaches.   Swelling of the hands and feet.   Weight gain.   Hot flashes.  DIAGNOSIS  To make a diagnosis, your caregiver will ask questions to confirm that you are having a pattern of symptoms. Symptoms must:   Be present 5 days before the start of your period and be present at least 3 months in a row.   End within 4 days  after your period starts.   Interfere with some of your normal activities.  Other conditions, such as thyroid disease, depression, and migraine headaches must be ruled out before a diagnosis of PMS is confirmed.  TREATMENT  Your caregiver may suggest ways to maintain a healthy lifestyle, such as exercise. Over-the-counter pain relievers may ease cramps, aches, pains, headaches, and breast tenderness. However, selective serotonin reuptake inhibitors (SSRIs) are medicines that are most beneficial in improving PMS if taken in the second half of the monthly cycle. They may be taken on a daily basis. The most effective oral contraceptive pill used for symptoms of PMS is one that contains the ingredient drospirenone. Taking 4 days off of the pill instead of the usual 7 days also has shown to increase effectiveness.  There are a number of drugs, dietary supplements, vitamins, and water pills (diuretics) which have been suggested to be helpful but have not shown to be of any benefit to improving PMS symptoms.  HOME CARE INSTRUCTIONS   For 2-3 months, write down your symptoms, their severity, and how long they last. This may help your caregiver prescribe the best treatment for your symptoms.  Exercise regularly as suggested by your caregiver.  Eat a regular, well-balanced diet.  Avoid caffeine, alcohol, and tobacco consumption.  Limit salt and salty foods to lessen bloating and fluid retention.  Get enough sleep. Practice relaxation techniques.  Drink enough fluids to keep your urine clear or pale yellow.  Take medicines as directed by your caregiver.  Limit stress.  Take a multivitamin as directed by your   caregiver.   This information is not intended to replace advice given to you by your health care provider. Make sure you discuss any questions you have with your health care provider.   Document Released: 10/15/2000 Document Revised: 07/12/2012 Document Reviewed: 03/06/2012 Elsevier  Interactive Patient Education 2016 Elsevier Inc.  

## 2016-04-09 NOTE — Progress Notes (Signed)
       Patient: Debbie Kidd Female    DOB: Nov 21, 1990   25 y.o.   MRN: [id number] Visit Date: 04/09/2016  Today's Provider: Margaretann LovelessJennifer M Kyrie Fludd, PA-C   Chief Complaint  Patient presents with  . Follow-up    PMDD   Subjective:    HPI Patient is here for her 3 month follow-up on Premenstrual Disphoric Disorder. She was fairly stable last office visit and Lexapro was increased to 20 mg. She reports today that the Lexapro is helping a lot with the anxiety. Panic attacks are way less frequent she is able to concentrate more. She is not taking any xanax currently.  She does continue to have her IUD in place. She reports menstrual irregularity with it. IUD has been in place for 2 years. She has a 5 year Mirena.      Allergies  Allergen Reactions  . Other Hives    Other reaction(s): Eczema   Current Meds  Medication Sig  . ALPRAZolam (XANAX) 0.25 MG tablet Take 1 tablet (0.25 mg total) by mouth 3 (three) times daily as needed for anxiety.  Marland Kitchen. escitalopram (LEXAPRO) 20 MG tablet Take 1 tablet (20 mg total) by mouth daily.  Marland Kitchen. levonorgestrel (MIRENA) 20 MCG/24HR IUD 1 each by Intrauterine route once.    Review of Systems  Constitutional: Negative for fatigue.  Respiratory: Negative.   Cardiovascular: Negative for chest pain, palpitations and leg swelling.  Gastrointestinal: Negative.   Genitourinary: Negative.   Psychiatric/Behavioral: Negative for dysphoric mood. The patient is not nervous/anxious (When she gets the attacks, she feels about the same as before but it sudside faster).     Social History  Substance Use Topics  . Smoking status: Never Smoker   . Smokeless tobacco: Never Used  . Alcohol Use: 0.0 oz/week    0 Standard drinks or equivalent per week     Comment: OCCASIONALLY   Objective:   BP 90/58 mmHg  Pulse 75  Temp(Src) 98.4 F (36.9 C) (Oral)  Resp 16  Wt 122 lb 6.4 oz (55.52 kg)  LMP   Physical Exam  Constitutional: She appears well-developed and  well-nourished. No distress.  Neck: Normal range of motion. Neck supple.  Cardiovascular: Normal rate, regular rhythm and normal heart sounds.  Exam reveals no gallop and no friction rub.   No murmur heard. Pulmonary/Chest: Effort normal and breath sounds normal. No respiratory distress. She has no wheezes. She has no rales.  Skin: She is not diaphoretic.  Psychiatric: She has a normal mood and affect. Her behavior is normal. Judgment and thought content normal.  Vitals reviewed.       Assessment & Plan:     1. PMDD (premenstrual dysphoric disorder) Stable. Lexapro refilled as below. She is to call if she has any worsening symptoms, acute issue, questions or concerns. If not I will see her back in Aug 2018 for her CPE w/ pap. - escitalopram (LEXAPRO) 20 MG tablet; Take 1 tablet (20 mg total) by mouth daily.  Dispense: 30 tablet; Refill: 11       Margaretann LovelessJennifer M Cassiopeia Florentino, PA-C  [facility]

## 2016-05-19 ENCOUNTER — Ambulatory Visit: Payer: Managed Care, Other (non HMO) | Admitting: Physician Assistant

## 2016-07-12 ENCOUNTER — Other Ambulatory Visit: Payer: Self-pay | Admitting: Physician Assistant

## 2016-07-12 DIAGNOSIS — F3281 Premenstrual dysphoric disorder: Secondary | ICD-10-CM

## 2016-07-12 MED ORDER — ESCITALOPRAM OXALATE 20 MG PO TABS
20.0000 mg | ORAL_TABLET | Freq: Every day | ORAL | 3 refills | Status: DC
Start: 2016-07-12 — End: 2017-07-19

## 2016-07-12 NOTE — Progress Notes (Signed)
Escitalopram 20mg  sent in to [facility]

## 2016-09-10 ENCOUNTER — Ambulatory Visit (INDEPENDENT_AMBULATORY_CARE_PROVIDER_SITE_OTHER): Payer: Managed Care, Other (non HMO) | Admitting: Physician Assistant

## 2016-09-10 ENCOUNTER — Encounter: Payer: Self-pay | Admitting: Physician Assistant

## 2016-09-10 VITALS — BP 100/60 | HR 83 | Temp 98.3°F | Resp 16 | Wt 126.6 lb

## 2016-09-10 DIAGNOSIS — R110 Nausea: Secondary | ICD-10-CM

## 2016-09-10 NOTE — Progress Notes (Signed)
Patient: Debbie Kidd Female    DOB: 13-Jun-1991   25 y.o.   MRN: [id number] Visit Date: 09/10/2016  Today's Provider: Margaretann LovelessJennifer M Damoni Causby, PA-C   Chief Complaint  Patient presents with  . Nausea   Subjective:    HPI Patient is here with c/o feeling nauseous after eating sometimes for the past 3 weeks. She denies abdominal pain, possible pregnancy, chest pain, or heart burn. Has the salivation but has not vomited. Does not occur after every meal. Meal of steak and potatoes triggered most recently. Occurs as early as one hour after eating. Has only happened 2-3 times over the last 3 weeks. No diarrhea or constipation. Gets an empty feeling in her stomach prior to the nausea. No increased stress. Grandmother had stomach ulcers. No UC or crohns personal or family history known. Mom had gallstones. Occurred after a meal including shrimp.   She reports that she got her flu vaccine 08/12/16.    Allergies  Allergen Reactions  . Other Hives    Other reaction(s): Eczema     Current Outpatient Prescriptions:  .  ALPRAZolam (XANAX) 0.25 MG tablet, Take 1 tablet (0.25 mg total) by mouth 3 (three) times daily as needed for anxiety., Disp: 60 tablet, Rfl: 1 .  escitalopram (LEXAPRO) 20 MG tablet, Take 1 tablet (20 mg total) by mouth daily., Disp: 90 tablet, Rfl: 3 .  levonorgestrel (MIRENA) 20 MCG/24HR IUD, 1 each by Intrauterine route once., Disp: , Rfl:  .  triamcinolone cream (KENALOG) 0.5 %, Reported on 04/09/2016, Disp: , Rfl: 0  Review of Systems  Constitutional: Negative.   Respiratory: Negative.   Cardiovascular: Negative for chest pain.  Gastrointestinal: Positive for nausea. Negative for abdominal distention, abdominal pain, anal bleeding, blood in stool, constipation, diarrhea, rectal pain and vomiting.  Genitourinary: Negative.   Allergic/Immunologic: Negative for environmental allergies and food allergies.  Neurological: Negative.   Psychiatric/Behavioral: Negative.      Social History  Substance Use Topics  . Smoking status: Never Smoker  . Smokeless tobacco: Never Used  . Alcohol use 0.0 oz/week     Comment: OCCASIONALLY   Objective:   BP 100/60 (BP Location: Right Arm, Patient Position: Sitting, Cuff Size: Normal)   Pulse 83   Temp 98.3 F (36.8 C) (Oral)   Resp 16   Wt 126 lb 9.6 oz (57.4 kg)   BMI 21.73 kg/m   Physical Exam  Constitutional: She is oriented to person, place, and time. She appears well-developed and well-nourished. No distress.  Cardiovascular: Normal rate, regular rhythm and normal heart sounds.  Exam reveals no gallop and no friction rub.   No murmur heard. Pulmonary/Chest: Effort normal and breath sounds normal. No respiratory distress. She has no wheezes. She has no rales.  Abdominal: Soft. Normal appearance and bowel sounds are normal. She exhibits no distension and no mass. There is no hepatosplenomegaly. There is generalized tenderness. There is no rebound, no guarding and no CVA tenderness.  Neurological: She is alert and oriented to person, place, and time.  Skin: Skin is warm and dry. She is not diaphoretic.  Psychiatric: She has a normal mood and affect. Her behavior is normal. Judgment and thought content normal.  Vitals reviewed.      Assessment & Plan:     1. Nausea without vomiting No obvious trigger or pattern noted. She is questioning if it could be food allergy related but has been unable to pinpoint one food. She is questioning shrimp since  this has happened a couple times after eating shrimp. I will check an basic allergen profile. If testing is negative and she is continuing to have nausea may consider GI referral. - Allergen Profile, Basic Food       Margaretann LovelessJennifer M Rueben Kassim, PA-C  [facility]

## 2016-09-10 NOTE — Patient Instructions (Signed)
Nausea, Adult °Nausea is the feeling that you have an upset stomach or have to vomit. Nausea by itself is not likely a serious concern, but it may be an early sign of more serious medical problems. As nausea gets worse, it can lead to vomiting. If vomiting develops, there is the risk of dehydration.  °CAUSES  °· Viral infections. °· Food poisoning. °· Medicines. °· Pregnancy. °· Motion sickness. °· Migraine headaches. °· Emotional distress. °· Severe pain from any source. °· Alcohol intoxication. °HOME CARE INSTRUCTIONS °· Get plenty of rest. °· Ask your caregiver about specific rehydration instructions. °· Eat small amounts of food and sip liquids more often. °· Take all medicines as told by your caregiver. °SEEK MEDICAL CARE IF: °· You have not improved after 2 days, or you get worse. °· You have a headache. °SEEK IMMEDIATE MEDICAL CARE IF:  °· You have a fever. °· You faint. °· You keep vomiting or have blood in your vomit. °· You are extremely weak or dehydrated. °· You have dark or bloody stools. °· You have severe chest or abdominal pain. °MAKE SURE YOU: °· Understand these instructions. °· Will watch your condition. °· Will get help right away if you are not doing well or get worse. °  °This information is not intended to replace advice given to you by your health care provider. Make sure you discuss any questions you have with your health care provider. °  °Document Released: 11/25/2004 Document Revised: 11/08/2014 Document Reviewed: 06/30/2011 °Elsevier Interactive Patient Education ©2016 Elsevier Inc. ° °

## 2016-09-13 LAB — ALLERGEN PROFILE, BASIC FOOD
Beef IgE: <0.1 kU/L
Chocolate/Cacao IgE: <0.1 kU/L
Food Mix (Seafoods) IgE: NEGATIVE
Milk IgE: <0.1 kU/L
Peanut IgE: <0.1 kU/L
Soybean IgE: <0.1 kU/L
Wheat IgE: <0.1 kU/L

## 2016-09-14 ENCOUNTER — Telehealth: Payer: Self-pay

## 2016-09-14 NOTE — Telephone Encounter (Signed)
Patient advised as directed below. She prefers to wait for the GI referral. She reports that she has been doing well for three days.  Thanks,  -Aribella Vavra

## 2016-09-14 NOTE — Telephone Encounter (Signed)
-----   Message from Margaretann LovelessJennifer M Burnette, [place]PA-C sent at 09/14/2016 10:47 AM EST ----- Food allergy testing for milk, wheat, eggs, peanuts, soy, pork, seafoods, corn were all negative. If concerned and still having the abdominal pain regularly we can refer to GI if wanted.

## 2016-09-14 NOTE — Telephone Encounter (Signed)
[id number] 

## 2016-09-15 NOTE — Telephone Encounter (Signed)
Ok

## 2017-01-05 ENCOUNTER — Encounter: Payer: Self-pay | Admitting: Physician Assistant

## 2017-01-05 ENCOUNTER — Ambulatory Visit (INDEPENDENT_AMBULATORY_CARE_PROVIDER_SITE_OTHER): Payer: Managed Care, Other (non HMO) | Admitting: Physician Assistant

## 2017-01-05 VITALS — BP 98/60 | HR 81 | Temp 98.6°F | Resp 16 | Wt 131.2 lb

## 2017-01-05 DIAGNOSIS — R10817 Generalized abdominal tenderness: Secondary | ICD-10-CM

## 2017-01-05 DIAGNOSIS — R110 Nausea: Secondary | ICD-10-CM | POA: Diagnosis not present

## 2017-01-05 NOTE — Progress Notes (Signed)
Patient: Debbie JasperLeah Broberg Female    DOB: 1991/05/08   26 y.o.   MRN: [id number] Visit Date: 01/05/2017  Today's Provider: Margaretann LovelessJennifer M Luberta Grabinski, PA-C   Chief Complaint  Patient presents with  . Nausea   Subjective:    HPI Patient is here today with c/o of feeling nauseous after eating dinner two days ago. Patient reports she is still having the symptoms. She denies any vomiting. She reports that this time she ate white rice and a chicken breast. Last time it was steak and potatoes. She reports she has not had the symptoms since she was last seen for this at the end of November 2017. She did undergo a basic allergy food panel testing that was normal. No family history of UC or Crohn's. Grandmother did have stomach ulcers. She denies any cough, heartburn or indigestion with it. We have not tried any anti-emetics like phenergan or zofran due to interactions with lexapro, which she takes for PMDD.     Allergies  Allergen Reactions  . Other Hives    Other reaction(s): Eczema     Current Outpatient Prescriptions:  .  ALPRAZolam (XANAX) 0.25 MG tablet, Take 1 tablet (0.25 mg total) by mouth 3 (three) times daily as needed for anxiety., Disp: 60 tablet, Rfl: 1 .  escitalopram (LEXAPRO) 20 MG tablet, Take 1 tablet (20 mg total) by mouth daily., Disp: 90 tablet, Rfl: 3 .  levonorgestrel (MIRENA) 20 MCG/24HR IUD, 1 each by Intrauterine route once., Disp: , Rfl:  .  triamcinolone cream (KENALOG) 0.5 %, Reported on 04/09/2016, Disp: , Rfl: 0  Review of Systems  Constitutional: Negative.   Cardiovascular: Negative for chest pain, palpitations and leg swelling.  Gastrointestinal: Positive for nausea. Negative for abdominal distention, abdominal pain, blood in stool, constipation, diarrhea and vomiting.  Genitourinary: Negative.   Neurological: Negative.   Psychiatric/Behavioral: Negative.     Social History  Substance Use Topics  . Smoking status: Never Smoker  . Smokeless tobacco: Never  Used  . Alcohol use 0.0 oz/week     Comment: OCCASIONALLY   Objective:   BP 98/60 (BP Location: Right Arm, Patient Position: Sitting, Cuff Size: Normal)   Pulse 81   Temp 98.6 F (37 C) (Oral)   Resp 16   Wt 131 lb 3.2 oz (59.5 kg)   BMI 22.52 kg/m     Physical Exam  Constitutional: She is oriented to person, place, and time. She appears well-developed and well-nourished. No distress.  Cardiovascular: Normal rate, regular rhythm and normal heart sounds.  Exam reveals no gallop and no friction rub.   No murmur heard. Pulmonary/Chest: Effort normal and breath sounds normal. No respiratory distress. She has no wheezes. She has no rales.  Abdominal: Soft. Normal appearance and bowel sounds are normal. She exhibits no distension and no mass. There is no hepatosplenomegaly. There is generalized tenderness. There is no rebound, no guarding and no CVA tenderness.  Neurological: She is alert and oriented to person, place, and time.  Skin: Skin is warm and dry. She is not diaphoretic.      Assessment & Plan:     1. Nausea Advised patient to use pepto bismol for nausea since other medications interact with lexapro. Also advised to continue to eat smaller meals, since larger meals seem to trigger. She has had GI issues for a while and does report having been evaluated for celiac before. She also reports she does not have regular BM and this has  been an issue for a while, but does not bother her. Will refer to GI for further evaluation. Referral placed as below.  - Ambulatory referral to Gastroenterology  2. Generalized abdominal tenderness without rebound tenderness See above medical treatment plan. - Ambulatory referral to Gastroenterology       Margaretann Loveless, PA-C  [facility]

## 2017-01-05 NOTE — Patient Instructions (Signed)
Nausea, Adult  Nausea is the feeling of an upset stomach or having to vomit. Nausea on its own is not usually a serious concern, but it may be an early sign of a more serious medical problem. As nausea gets worse, it can lead to vomiting. If vomiting develops, or if you are not able to drink enough fluids, you are at risk of becoming dehydrated. Dehydration can make you tired and thirsty, cause you to have a dry mouth, and decrease how often you urinate. Older adults and people with other diseases or a weak immune system are at higher risk for dehydration. The main goals of treating your nausea are:  · To limit repeated nausea episodes.  · To prevent vomiting and dehydration.    Follow these instructions at home:  Follow instructions from your health care provider about how to care for yourself at home.  Eating and drinking   Follow these recommendations as told by your health care provider:  · Take an oral rehydration solution (ORS). This is a drink that is sold at pharmacies and retail stores.  · Drink clear fluids in small amounts as you are able. Clear fluids include water, ice chips, diluted fruit juice, and low-calorie sports drinks.  · Eat bland, easy-to-digest foods in small amounts as you are able. These foods include bananas, applesauce, rice, lean meats, toast, and crackers.  · Avoid drinking fluids that contain a lot of sugar or caffeine, such as energy drinks, sports drinks, and soda.  · Avoid alcohol.  · Avoid spicy or fatty foods.    General instructions   · Drink enough fluid to keep your urine clear or pale yellow.  · Wash your hands often. If soap and water are not available, use hand sanitizer.  · Make sure that all people in your household wash their hands well and often.  · Rest at home while you recover.  · Take over-the-counter and prescription medicines only as told by your health care provider.  · Breathe slowly and deeply when you feel nauseous.  · Watch your condition for any  changes.  · Keep all follow-up visits as told by your health care provider. This is important.  Contact a health care provider if:  · You have a headache.  · You have new symptoms.  · Your nausea gets worse.  · You have a fever.  · You feel light-headed or dizzy.  · You vomit.  · You cannot keep fluids down.  Get help right away if:  · You have pain in your chest, neck, arm, or jaw.  · You feel extremely weak or you faint.  · You have vomit that is bright red or looks like coffee grounds.  · You have bloody or black stools or stools that look like tar.  · You have a severe headache, a stiff neck, or both.  · You have severe pain, cramping, or bloating in your abdomen.  · You have a rash.  · You have difficulty breathing or are breathing very quickly.  · Your heart is beating very quickly.  · Your skin feels cold and clammy.  · You feel confused.  · You have pain when you urinate.  · You have signs of dehydration, such as:  ? Dark urine, very little, or no urine.  ? Cracked lips.  ? Dry mouth.  ? Sunken eyes.  ? Sleepiness.  ? Weakness.  These symptoms may represent a serious problem that is an emergency. Do   not wait to see if the symptoms will go away. Get medical help right away. Call your local emergency services (911 in the [place] Do not drive yourself to the hospital.  This information is not intended to replace advice given to you by your health care provider. Make sure you discuss any questions you have with your health care provider.  Document Released: 11/25/2004 Document Revised: 03/22/2016 Document Reviewed: 06/24/2015  Elsevier Interactive Patient Education © 2017 Elsevier Inc.

## 2017-02-07 ENCOUNTER — Ambulatory Visit: Payer: Managed Care, Other (non HMO) | Admitting: Gastroenterology

## 2017-03-15 ENCOUNTER — Ambulatory Visit: Payer: Managed Care, Other (non HMO) | Admitting: Gastroenterology

## 2017-06-21 ENCOUNTER — Encounter: Payer: Self-pay | Admitting: Physician Assistant

## 2017-06-21 ENCOUNTER — Ambulatory Visit (INDEPENDENT_AMBULATORY_CARE_PROVIDER_SITE_OTHER): Payer: Managed Care, Other (non HMO) | Admitting: Physician Assistant

## 2017-06-21 VITALS — BP 90/60 | HR 80 | Temp 98.3°F | Resp 16 | Ht 64.0 in | Wt 127.8 lb

## 2017-06-21 DIAGNOSIS — R109 Unspecified abdominal pain: Secondary | ICD-10-CM | POA: Diagnosis not present

## 2017-06-21 DIAGNOSIS — R197 Diarrhea, unspecified: Secondary | ICD-10-CM

## 2017-06-21 DIAGNOSIS — R1030 Lower abdominal pain, unspecified: Secondary | ICD-10-CM

## 2017-06-21 NOTE — Patient Instructions (Signed)
Diarrhea, Adult Diarrhea is frequent loose and watery bowel movements. Diarrhea can make you feel weak and cause you to become dehydrated. Dehydration can make you tired and thirsty, cause you to have a dry mouth, and decrease how often you urinate. Diarrhea typically lasts 2-3 days. However, it can last longer if it is a sign of something more serious. It is important to treat your diarrhea as told by your health care provider. Follow these instructions at home: Eating and drinking  Follow these recommendations as told by your health care provider:  Take an oral rehydration solution (ORS). This is a drink that is sold at pharmacies and retail stores.  Drink clear fluids, such as water, ice chips, diluted fruit juice, and low-calorie sports drinks.  Eat bland, easy-to-digest foods in small amounts as you are able. These foods include bananas, applesauce, rice, lean meats, toast, and crackers.  Avoid drinking fluids that contain a lot of sugar or caffeine, such as energy drinks, sports drinks, and soda.  Avoid alcohol.  Avoid spicy or fatty foods.  General instructions  Drink enough fluid to keep your urine clear or pale yellow.  Wash your hands often. If soap and water are not available, use hand sanitizer.  Make sure that all people in your household wash their hands well and often.  Take over-the-counter and prescription medicines only as told by your health care provider.  Rest at home while you recover.  Watch your condition for any changes.  Take a warm bath to relieve any burning or pain from frequent diarrhea episodes.  Keep all follow-up visits as told by your health care provider. This is important. Contact a health care provider if:  You have a fever.  Your diarrhea gets worse.  You have new symptoms.  You cannot keep fluids down.  You feel light-headed or dizzy.  You have a headache  You have muscle cramps. Get help right away if:  You have chest  pain.  You feel extremely weak or you faint.  You have bloody or black stools or stools that look like tar.  You have severe pain, cramping, or bloating in your abdomen.  You have trouble breathing or you are breathing very quickly.  Your heart is beating very quickly.  Your skin feels cold and clammy.  You feel confused.  You have signs of dehydration, such as: ? Dark urine, very little urine, or no urine. ? Cracked lips. ? Dry mouth. ? Sunken eyes. ? Sleepiness. ? Weakness. This information is not intended to replace advice given to you by your health care provider. Make sure you discuss any questions you have with your health care provider. Document Released: 10/08/2002 Document Revised: 02/26/2016 Document Reviewed: 06/24/2015 Elsevier Interactive Patient Education  2017 [place] Diet A bland diet consists of foods that do not have a lot of fat or fiber. Foods without fat or fiber are easier for the body to digest. They are also less likely to irritate your mouth, throat, stomach, and other parts of your gastrointestinal tract. A bland diet is sometimes called a BRAT diet. What is my plan? Your health care provider or dietitian may recommend specific changes to your diet to prevent and treat your symptoms, such as:  Eating small meals often.  Cooking food until it is soft enough to chew easily.  Chewing your food well.  Drinking fluids slowly.  Not eating foods that are very spicy, sour, or fatty.  Not eating citrus fruits, such as  oranges and grapefruit.  What do I need to know about this diet?  Eat a variety of foods from the bland diet food list.  Do not follow a bland diet longer than you have to.  Ask your health care provider whether you should take vitamins. What foods can I eat? Grains  Hot cereals, such as cream of wheat. Bread, crackers, or tortillas made from refined white flour. Rice. Vegetables Canned or cooked vegetables. Mashed or  boiled potatoes. Fruits Bananas. Applesauce. Other types of cooked or canned fruit with the skin and seeds removed, such as canned peaches or pears. Meats and Other Protein Sources Scrambled eggs. Creamy peanut butter or other nut butters. Lean, well-cooked meats, such as chicken or fish. Tofu. Soups or broths. Dairy Low-fat dairy products, such as milk, cottage cheese, or yogurt. Beverages Water. Herbal tea. Apple juice. Sweets and Desserts Pudding. Custard. Fruit gelatin. Ice cream. Fats and Oils Mild salad dressings. Canola or olive oil. The items listed above may not be a complete list of allowed foods or beverages. Contact your dietitian for more options. What foods are not recommended? Foods and ingredients that are often not recommended include:  Spicy foods, such as hot sauce or salsa.  Fried foods.  Sour foods, such as pickled or fermented foods.  Raw vegetables or fruits, especially citrus or berries.  Caffeinated drinks.  Alcohol.  Strongly flavored seasonings or condiments.  The items listed above may not be a complete list of foods and beverages that are not allowed. Contact your dietitian for more information. This information is not intended to replace advice given to you by your health care provider. Make sure you discuss any questions you have with your health care provider. Document Released: 02/09/2016 Document Revised: 03/25/2016 Document Reviewed: 10/30/2014 Elsevier Interactive Patient Education  2018 Elsevier Inc. Irritable Bowel Syndrome, Adult Irritable bowel syndrome (IBS) is not one specific disease. It is a group of symptoms that affects the organs responsible for digestion (gastrointestinal or GI tract). To regulate how your GI tract works, your body sends signals back and forth between your intestines and your brain. If you have IBS, there may be a problem with these signals. As a result, your GI tract does not function normally. Your intestines may  become more sensitive and overreact to certain things. This is especially true when you eat certain foods or when you are under stress. There are four types of IBS. These may be determined based on the consistency of your stool:  IBS with diarrhea.  IBS with constipation.  Mixed IBS.  Unsubtyped IBS.  It is important to know which type of IBS you have. Some treatments are more likely to be helpful for certain types of IBS. What are the causes? The exact cause of IBS is not known. What increases the risk? You may have a higher risk of IBS if:  You are a woman.  You are younger than 27 years old.  You have a family history of IBS.  You have mental health problems.  You have had bacterial infection of your GI tract.  What are the signs or symptoms? Symptoms of IBS vary from person to person. The main symptom is abdominal pain or discomfort. Additional symptoms usually include one or more of the following:  Diarrhea, constipation, or both.  Abdominal swelling or bloating.  Feeling full or sick after eating a small or regular-size meal.  Frequent gas.  Mucus in the stool.  A feeling of having more stool  left after a bowel movement.  Symptoms tend to come and go. They may be associated with stress, psychiatric conditions, or nothing at all. How is this diagnosed? There is no specific test to diagnose IBS. Your health care provider will make a diagnosis based on a physical exam, medical history, and your symptoms. You may have other tests to rule out other conditions that may be causing your symptoms. These may include:  Blood tests.  X-rays.  CT scan.  Endoscopy and colonoscopy. This is a test in which your GI tract is viewed with a long, thin, flexible tube.  How is this treated? There is no cure for IBS, but treatment can help relieve symptoms. IBS treatment often includes:  Changes to your diet, such as: ? Eating more fiber. ? Avoiding foods that cause  symptoms. ? Drinking more water. ? Eating regular, medium-sized portioned meals.  Medicines. These may include: ? Fiber supplements if you have constipation. ? Medicine to control diarrhea (antidiarrheal medicines). ? Medicine to help control muscle spasms in your GI tract (antispasmodic medicines). ? Medicines to help with any mental health issues, such as antidepressants or tranquilizers.  Therapy. ? Talk therapy may help with anxiety, depression, or other mental health issues that can make IBS symptoms worse.  Stress reduction. ? Managing your stress can help keep symptoms under control.  Follow these instructions at home:  Take medicines only as directed by your health care provider.  Eat a healthy diet. ? Avoid foods and drinks with added sugar. ? Include more whole grains, fruits, and vegetables gradually into your diet. This may be especially helpful if you have IBS with constipation. ? Avoid any foods and drinks that make your symptoms worse. These may include dairy products and caffeinated or carbonated drinks. ? Do not eat large meals. ? Drink enough fluid to keep your urine clear or pale yellow.  Exercise regularly. Ask your health care provider for recommendations of good activities for you.  Keep all follow-up visits as directed by your health care provider. This is important. Contact a health care provider if:  You have constant pain.  You have trouble or pain with swallowing.  You have worsening diarrhea. Get help right away if:  You have severe and worsening abdominal pain.  You have diarrhea and: ? You have a rash, stiff neck, or severe headache. ? You are irritable, sleepy, or difficult to awaken. ? You are weak, dizzy, or extremely thirsty.  You have bright red blood in your stool or you have black tarry stools.  You have unusual abdominal swelling that is painful.  You vomit continuously.  You vomit blood (hematemesis).  You have both  abdominal pain and a fever. This information is not intended to replace advice given to you by your health care provider. Make sure you discuss any questions you have with your health care provider. Document Released: 10/18/2005 Document Revised: 03/19/2016 Document Reviewed: 07/05/2014 Elsevier Interactive Patient Education  2018 [employer].

## 2017-06-21 NOTE — Progress Notes (Signed)
Patient: Debbie Kidd Female    DOB: [birth year]   26 y.o.   MRN: [id number] Visit Date: 06/21/2017  Today's Provider: Margaretann Loveless, PA-C   Chief Complaint  Patient presents with  . Diarrhea   Subjective:    HPI  Diarrhea: Patient complains of diarrhea. Onset of diarrhea was two weeks. Diarrhea is occurring approximately 3 times per day. Patient describes diarrhea as watery. Diarrhea has been associated with eating.  Patient denies blood in stool, fever, recent antibiotic use.  Previous visits for diarrhea: none. Evaluation to date: none. Treatment to date: Pepto, pt reports medication helped relief frequency.      Allergies  Allergen Reactions  . Other Hives    Other reaction(s): Eczema     Current Outpatient Prescriptions:  .  escitalopram (LEXAPRO) 20 MG tablet, Take 1 tablet (20 mg total) by mouth daily., Disp: 90 tablet, Rfl: 3 .  levonorgestrel (MIRENA) 20 MCG/24HR IUD, 1 each by Intrauterine route once., Disp: , Rfl:  .  triamcinolone cream (KENALOG) 0.5 %, Reported on 04/09/2016, Disp: , Rfl: 0  Review of Systems  Constitutional: Negative.  Negative for fatigue and fever.  Respiratory: Negative.   Cardiovascular: Negative.   Gastrointestinal: Positive for abdominal pain and diarrhea.  Genitourinary: Negative.   Musculoskeletal: Negative.   Neurological: Negative.     Social History  Substance Use Topics  . Smoking status: Never Smoker  . Smokeless tobacco: Never Used  . Alcohol use 0.0 oz/week     Comment: OCCASIONALLY   Objective:   BP 90/60 (BP Location: Left Arm, Patient Position: Sitting, Cuff Size: Normal)   Pulse 80   Temp 98.3 F (36.8 C)   Resp 16   Ht 5\' 4"  (1.626 m)   Wt 127 lb 12.8 oz (58 kg)   BMI 21.94 kg/m  Vitals:   06/21/17 1112  BP: 90/60  Pulse: 80  Resp: 16  Temp: 98.3 F (36.8 C)  Weight: 127 lb 12.8 oz (58 kg)  Height: 5\' 4"  (1.626 m)     Physical Exam  Constitutional: She is oriented to person, place, and  time. She appears well-developed and well-nourished. No distress.  Cardiovascular: Normal rate, regular rhythm and normal heart sounds.  Exam reveals no gallop and no friction rub.   No murmur heard. Pulmonary/Chest: Effort normal and breath sounds normal. No respiratory distress. She has no wheezes. She has no rales.  Abdominal: Soft. Normal appearance and bowel sounds are normal. She exhibits no distension and no mass. There is no hepatosplenomegaly. There is generalized tenderness. There is no rebound, no guarding and no CVA tenderness.  Neurological: She is alert and oriented to person, place, and time.  Skin: Skin is warm and dry. She is not diaphoretic.  Vitals reviewed.       Assessment & Plan:     1. Diarrhea, unspecified type DDx: IBS-D, gastroenteritis, cholecystitis, constipation. Patient declines any recent stressors. Will check labs and stool testing as below. Patient prefers to have testing completed initially. If all testing is normal will try immodium. If immodium fails may consider medication such as bentyl for IBS D and abdominal cramping. Patient has also previously been referred to GI for nausea and other GI symptoms but she has not yet went through with referral due to symptom improvement.  - CBC w/Diff/Platelet - Basic Metabolic Panel (BMET) - Lipase - Stool Culture - Stool C-Diff Toxin Assay - Ova and parasite examination  2. Abdominal cramping See  above medical treatment plan. - CBC w/Diff/Platelet - Basic Metabolic Panel (BMET) - Lipase - Stool Culture - Stool C-Diff Toxin Assay - Ova and parasite examination  3. Lower abdominal pain See above medical treatment plan. - CBC w/Diff/Platelet - Basic Metabolic Panel (BMET) - Lipase - Stool Culture - Stool C-Diff Toxin Assay - Ova and parasite examination       Margaretann Loveless, PA-C  [facility]

## 2017-06-22 LAB — CBC WITH DIFFERENTIAL/PLATELET
Basophils Absolute: 0 10*3/uL (ref 0.0–0.2)
Basos: 1 %
EOS (ABSOLUTE): 0.1 10*3/uL (ref 0.0–0.4)
Eos: 2 %
HEMOGLOBIN: 13 g/dL (ref 11.1–15.9)
Hematocrit: 38.8 % (ref 34.0–46.6)
Immature Grans (Abs): 0 10*3/uL (ref 0.0–0.1)
Immature Granulocytes: 0 %
LYMPHS: 38 %
Lymphocytes Absolute: 2.2 10*3/uL (ref 0.7–3.1)
MCH: 31 pg (ref 26.6–33.0)
MCHC: 33.5 g/dL (ref 31.5–35.7)
MCV: 93 fL (ref 79–97)
Monocytes Absolute: 0.5 10*3/uL (ref 0.1–0.9)
Monocytes: 8 %
NEUTROS ABS: 2.9 10*3/uL (ref 1.4–7.0)
Neutrophils: 51 %
Platelets: 329 10*3/uL (ref 150–379)
RBC: 4.19 x10E6/uL (ref 3.77–5.28)
RDW: 12.9 % (ref 12.3–15.4)
WBC: 5.7 10*3/uL (ref 3.4–10.8)

## 2017-06-22 LAB — BASIC METABOLIC PANEL
BUN/Creatinine Ratio: 18 (ref 9–23)
BUN: 15 mg/dL (ref 6–20)
CALCIUM: 9.3 mg/dL (ref 8.7–10.2)
CO2: 22 mmol/L (ref 20–29)
CREATININE: 0.82 mg/dL (ref 0.57–1.00)
Chloride: 102 mmol/L (ref 96–106)
GFR calc Af Amer: 113 mL/min/{1.73_m2} (ref >59)
GFR calc non Af Amer: 98 mL/min/{1.73_m2} (ref >59)
GLUCOSE: 81 mg/dL (ref 65–99)
Potassium: 4.2 mmol/L (ref 3.5–5.2)
Sodium: 140 mmol/L (ref 134–144)

## 2017-06-22 LAB — LIPASE: Lipase: 29 U/L (ref 14–72)

## 2017-06-23 LAB — CLOSTRIDIUM DIFFICILE EIA: C difficile Toxins A+B, EIA: NEGATIVE

## 2017-06-25 LAB — STOOL CULTURE: E coli, Shiga toxin Assay: NEGATIVE

## 2017-06-28 ENCOUNTER — Telehealth: Payer: Self-pay

## 2017-06-28 ENCOUNTER — Encounter: Payer: Self-pay | Admitting: Physician Assistant

## 2017-06-28 DIAGNOSIS — R197 Diarrhea, unspecified: Secondary | ICD-10-CM

## 2017-06-28 NOTE — Telephone Encounter (Signed)
Does she want to go to GI for evaluation? She may need to be evaluated for celiac or IBD (crohn's, UC).   She has been referred before for other GI issues and has not went.

## 2017-06-28 NOTE — Telephone Encounter (Signed)
Patient advised as below. Patient reports she is still having diarrhea. sd

## 2017-06-28 NOTE — Telephone Encounter (Signed)
Left message to call back  

## 2017-06-28 NOTE — Telephone Encounter (Signed)
Patient reports that she has tried imodium and it has helped a little. She mainly has gas and mucus from her stool.

## 2017-06-28 NOTE — Telephone Encounter (Signed)
Has she tried Immodium yet?  If not have her try this for a few days and call if no improvements.

## 2017-06-28 NOTE — Telephone Encounter (Signed)
-----   Message from Margaretann Loveless, [place] sent at 06/28/2017  8:47 AM EDT ----- All stool cultures have been negative. I am still awaiting Ova and parasite results as these have not been received yet. Can we see if she is still having diarrhea? Thanks.

## 2017-06-28 NOTE — Telephone Encounter (Signed)
Patient would like to proceed with GI referral for further evaluation.

## 2017-06-29 NOTE — Telephone Encounter (Signed)
Referral placed.

## 2017-06-29 NOTE — Addendum Note (Signed)
Addended by: Margaretann LovelessBURNETTE, Delisa Finck M on: 06/29/2017 12:37 PM   Modules accepted: Orders

## 2017-06-30 LAB — OVA AND PARASITE EXAMINATION

## 2017-07-06 ENCOUNTER — Ambulatory Visit (INDEPENDENT_AMBULATORY_CARE_PROVIDER_SITE_OTHER): Payer: Managed Care, Other (non HMO) | Admitting: Gastroenterology

## 2017-07-06 ENCOUNTER — Encounter: Payer: Self-pay | Admitting: Gastroenterology

## 2017-07-06 ENCOUNTER — Other Ambulatory Visit: Payer: Self-pay | Admitting: Gastroenterology

## 2017-07-06 VITALS — BP 104/68 | HR 83 | Ht 64.0 in | Wt 129.2 lb

## 2017-07-06 DIAGNOSIS — R197 Diarrhea, unspecified: Secondary | ICD-10-CM

## 2017-07-06 NOTE — Progress Notes (Signed)
Wyline MoodKiran Samya Siciliano MD, MRCP(U.K) [address]  [address]  [place], [place] [address]  Main: [phone]  Fax: [phone]   Gastroenterology Consultation  Referring Provider:     Suella Kidd, Debbie Kidd, P* Primary Care Physician:  Debbie LovelessBurnette, Debbie M, PA-C Primary Gastroenterologist:  Dr. Wyline MoodKiran Ariz Terrones  Reason for Consultation:     Diarrhea         HPI:   Debbie Kidd is a 26 y.o. y/o female referred for consultation & management  by Dr. Rosezetta SchlatterBurnette, Debbie BevelsJennifer M, PA-C.    She has been referred diarrhea of two weeks duration. Labs 06/21/17- Stool culture-negative, C diff toxin,OP -negative.CBC,BMP-normal .    Diarrhea :  Onset: 3-4 weeks back - no change since    Number of bowel movements a day : 0 if she takes imodium , 4-5 without imodium , sometimes has one in the middle of the night    Color : reddish brown-non bloody   Consistency:  Off imodium - mashed potato- not watery  Present status: ongoing    Shape of stool:  No    Weight loss:  Some  Prior colonoscopy:  No   Artificial sugars/sodas/chewing gum:  None    Bloating:  Yes  , abdominal distension, foul smelling stool,  Gas:  "very much "  Antibiotic use: none   No other sick contacts.   I plan to rule out infection with stool tests , if negative will proceed with a colonoscopy .    Past Medical History:  Diagnosis Date  . Ovarian cyst 2012   No surgery needed    Past Surgical History:  Procedure Laterality Date  . NO PAST SURGERIES      Prior to Admission medications   Medication Sig Start Date End Date Taking? Authorizing Provider  escitalopram (LEXAPRO) 20 MG tablet Take 1 tablet (20 mg total) by mouth daily. 07/12/16   Debbie LovelessBurnette, Debbie M, PA-C  levonorgestrel (MIRENA) 20 MCG/24HR IUD 1 each by Intrauterine route once.    [provider]  triamcinolone cream (KENALOG) 0.5 % Reported on 04/09/2016 03/21/15   [provider]    Family History  Problem Relation Age of Onset  . Healthy Mother    . Healthy Father   . Healthy Sister   . Healthy Sister   . Cancer Maternal Grandmother   . Alzheimer's disease Paternal Grandmother   . CVA Paternal Grandfather      Social History  Substance Use Topics  . Smoking status: Never Smoker  . Smokeless tobacco: Never Used  . Alcohol use 0.0 oz/week     Comment: OCCASIONALLY    Allergies as of 07/06/2017 - Review Complete 06/21/2017  Allergen Reaction Noted  . Other Hives 06/09/2015    Review of Systems:    All systems reviewed and negative except where noted in HPI.   Physical Exam:  There were no vitals taken for this visit. No LMP recorded. Patient is not currently having periods (Reason: IUD). Psych:  Alert and cooperative. Normal mood and affect. General:   Alert,  Well-developed, well-nourished, pleasant and cooperative in NAD Head:  Normocephalic and atraumatic. Eyes:  Sclera clear, no icterus.   Conjunctiva pink. Ears:  Normal auditory acuity. Nose:  No deformity, discharge, or lesions. Mouth:  No deformity or lesions,oropharynx pink & moist. Neck:  Supple; no masses or thyromegaly. Lungs:  Respirations even and unlabored.  Clear throughout to auscultation.   No wheezes, crackles, or rhonchi. No acute distress. Heart:  Regular rate  and rhythm; no murmurs, clicks, rubs, or gallops. Abdomen:  Normal bowel sounds.  No bruits.  Soft, non-tender and non-distended without masses, hepatosplenomegaly or hernias noted.  No guarding or rebound tenderness.    Neurologic:  Alert and oriented x3;  grossly normal neurologically. Skin:  Intact without significant lesions or rashes. No jaundice. Lymph Nodes:  No significant cervical adenopathy. Psych:  Alert and cooperative. Normal mood and affect.  Imaging Studies: No results found.  Assessment and Plan:   Debbie Kidd is a 26 y.o. y/o female has been referred for diarrhea of 4 weeks duration . History suggests more of an issue with bloating, distension and "mushy stool"  symptoms resolve when she takes 1 imodium every few days.   Plan  1. Celiac serology, TSH,GI stool PCR, fecal calpropectin 2. LOW FODMAP diet  3. If no better at next visit then will have to consider endoscopic evaluation +/- course of Xifaxan for IBS-D   Follow up in 2-3 weeks  Dr Wyline Mood MD,MRCP(U.K)

## 2017-07-07 LAB — CELIAC PANEL 10
Antigliadin Abs, IgA: 5 units (ref 0–19)
ENDOMYSIAL IGA: NEGATIVE
Gliadin IgG: 3 units (ref 0–19)
IGA/IMMUNOGLOBULIN A, SERUM: 335 mg/dL (ref 87–352)
Tissue Transglut Ab: <2 U/mL (ref 0–5)

## 2017-07-07 LAB — TSH: TSH: 2.02 u[IU]/mL (ref 0.450–4.500)

## 2017-07-12 LAB — GI PROFILE, STOOL, PCR
ADENOVIRUS F 40/41: NOT DETECTED
ASTROVIRUS: NOT DETECTED
C difficile toxin A/B: NOT DETECTED
CAMPYLOBACTER: NOT DETECTED
Cryptosporidium: NOT DETECTED
Cyclospora cayetanensis: NOT DETECTED
ENTEROAGGREGATIVE E COLI: NOT DETECTED
ENTEROTOXIGENIC E COLI: DETECTED — AB
Entamoeba histolytica: NOT DETECTED
Enteropathogenic E coli: DETECTED — AB
GIARDIA LAMBLIA: NOT DETECTED
NOROVIRUS GI/GII: NOT DETECTED
Plesiomonas shigelloides: NOT DETECTED
Rotavirus A: NOT DETECTED
SALMONELLA: NOT DETECTED
Sapovirus: NOT DETECTED
Shiga-toxin-producing E coli: NOT DETECTED
Shigella/Enteroinvasive E coli: NOT DETECTED
Vibrio cholerae: NOT DETECTED
Vibrio: NOT DETECTED
Yersinia enterocolitica: NOT DETECTED

## 2017-07-15 ENCOUNTER — Other Ambulatory Visit: Payer: Self-pay

## 2017-07-15 MED ORDER — AZITHROMYCIN 500 MG PO TABS: 500.0000 mg | ORAL_TABLET | Freq: Every day | ORAL | 0 refills | Status: AC

## 2017-07-18 LAB — CALPROTECTIN, FECAL: Calprotectin, Fecal: <16 ug/g (ref 0–120)

## 2017-07-19 ENCOUNTER — Other Ambulatory Visit: Payer: Self-pay | Admitting: Physician Assistant

## 2017-07-19 DIAGNOSIS — F3281 Premenstrual dysphoric disorder: Secondary | ICD-10-CM

## 2017-07-20 ENCOUNTER — Telehealth: Payer: Self-pay

## 2017-07-20 NOTE — Telephone Encounter (Signed)
Advised patient of results per Dr. Tobi Bastos.   calpropectin is negative suggestive of no gi tract inflammation

## 2017-07-20 NOTE — Telephone Encounter (Signed)
-----   Message from Wyline Mood, MD sent at 07/20/2017  5:30 AM EDT ----- calpropectin is negative suggestive of no gi tract inflammation

## 2017-07-27 ENCOUNTER — Encounter: Payer: Self-pay | Admitting: Gastroenterology

## 2017-07-27 ENCOUNTER — Ambulatory Visit: Payer: Managed Care, Other (non HMO) | Admitting: Gastroenterology

## 2017-07-27 ENCOUNTER — Other Ambulatory Visit: Payer: Self-pay

## 2017-07-27 ENCOUNTER — Ambulatory Visit (INDEPENDENT_AMBULATORY_CARE_PROVIDER_SITE_OTHER): Payer: Managed Care, Other (non HMO) | Admitting: Gastroenterology

## 2017-07-27 VITALS — BP 107/69 | HR 94 | Temp 98.2°F | Ht 64.0 in | Wt 130.4 lb

## 2017-07-27 DIAGNOSIS — A09 Infectious gastroenteritis and colitis, unspecified: Secondary | ICD-10-CM

## 2017-07-27 NOTE — Progress Notes (Signed)
   Wyline Mood MD, MRCP(U.K) [address]  [address]  [place], [place] [address]  Main: [phone]  Fax: [phone]   Primary Care Physician: Margaretann Loveless, PA-C  Primary Gastroenterologist:  Dr. Wyline Mood   No chief complaint on file.   HPI: Debbie Kidd is a 26 y.o. female   She is here today to follow up for diarrhea.    Summary of history :  She was initially referred and seen on 07/06/17 for non bloody diarrhea of 3-4  weeks duration.    Interval history   07/06/2017-  07/27/2017    Celiac serology, TSH,GI stool PCR, fecal calpropectin- negative 07/06/17 . GI PCR suggested positive for ETEC and EPEC.  She was given a course of Azithromycin which she completed. She says did great with the antibiotics but subsequently the symptoms have gradually returning with cramping , gas and slightly softer than usual bowel movements, no fevers, 2-3 bowel movements a day which is her baseline.    Current Outpatient Prescriptions  Medication Sig Dispense Refill  . escitalopram (LEXAPRO) 20 MG tablet TAKE 1 TABLET BY MOUTH  DAILY 90 tablet 1  . levonorgestrel (MIRENA) 20 MCG/24HR IUD 1 each by Intrauterine route once.    . triamcinolone cream (KENALOG) 0.5 % Reported on 04/09/2016  0   No current facility-administered medications for this visit.     Allergies as of 07/27/2017 - Review Complete 07/06/2017  Allergen Reaction Noted  . Other Hives 06/09/2015    ROS:  General: Negative for anorexia, weight loss, fever, chills, fatigue, weakness. ENT: Negative for hoarseness, difficulty swallowing , nasal congestion. CV: Negative for chest pain, angina, palpitations, dyspnea on exertion, peripheral edema.  Respiratory: Negative for dyspnea at rest, dyspnea on exertion, cough, sputum, wheezing.  GI: See history of present illness. GU:  Negative for dysuria, hematuria, urinary incontinence, urinary frequency, nocturnal urination.  Endo: Negative for unusual weight change.      Physical Examination:   There were no vitals taken for this visit.  General: Well-nourished, well-developed in no acute distress.  Eyes: No icterus. Conjunctivae pink. Mouth: Oropharyngeal mucosa moist and pink , no lesions erythema or exudate. Lungs: Clear to auscultation bilaterally. Non-labored. Heart: Regular rate and rhythm, no murmurs rubs or gallops.  Abdomen: Bowel sounds are normal, nontender, nondistended, no hepatosplenomegaly or masses, no abdominal bruits or hernia , no rebound or guarding.   Extremities: No lower extremity edema. No clubbing or deformities. Neuro: Alert and oriented x 3.  Grossly intact. Skin: Warm and dry, no jaundice.   Psych: Alert and cooperative, normal mood and affect.   Imaging Studies: No results found.  Assessment and Plan:   Debbie Kidd is a 26 y.o. y/o female here to follow up  for diarrhea of 4 weeks duration . Stool positive for EPEC and ETEC. Treated with Azithromycin. Presently still has some symptoms which could either be from the [facility] or just a post infectious IBS like symptoms   Plan  1. Recheck stool PCR and if negative trial of Probiotics.   Dr Wyline Mood  MD,MRCP [facility]) Follow up PRN

## 2017-08-01 ENCOUNTER — Other Ambulatory Visit: Payer: Self-pay | Admitting: Gastroenterology

## 2017-08-03 ENCOUNTER — Telehealth: Payer: Self-pay

## 2017-08-03 LAB — GI PROFILE, STOOL, PCR
Adenovirus F 40/41: NOT DETECTED
Astrovirus: NOT DETECTED
C difficile toxin A/B: NOT DETECTED
CAMPYLOBACTER: NOT DETECTED
CRYPTOSPORIDIUM: NOT DETECTED
Cyclospora cayetanensis: NOT DETECTED
ENTEROAGGREGATIVE E COLI: NOT DETECTED
ENTEROTOXIGENIC E COLI: NOT DETECTED
Entamoeba histolytica: NOT DETECTED
Enteropathogenic E coli: NOT DETECTED
Giardia lamblia: NOT DETECTED
NOROVIRUS GI/GII: NOT DETECTED
PLESIOMONAS SHIGELLOIDES: NOT DETECTED
ROTAVIRUS A: NOT DETECTED
SALMONELLA: NOT DETECTED
SHIGA-TOXIN-PRODUCING E COLI: NOT DETECTED
Sapovirus: NOT DETECTED
Shigella/Enteroinvasive E coli: NOT DETECTED
Vibrio cholerae: NOT DETECTED
Vibrio: NOT DETECTED
Yersinia enterocolitica: NOT DETECTED

## 2017-08-03 NOTE — Telephone Encounter (Signed)
Advised patient of results per Dr. Tobi Bastos.   Patient states she still has severe diarrhea and didn't know if Imodium was a long term solution.   Sent message to Dr. Tobi Bastos with patient symptoms. Waiting for response. Will advise patient accordingly.

## 2017-08-03 NOTE — Telephone Encounter (Signed)
-----   Message from Wyline Mood, MD sent at 08/03/2017  9:26 AM EDT ----- Inform stool tests are now negative

## 2017-08-04 ENCOUNTER — Telehealth: Payer: Self-pay

## 2017-08-04 NOTE — Telephone Encounter (Signed)
Advised patient of Dr. Johnney Killian response.   Patient states she will purchase a probiotic at a local pharmacy. Scheduling 4 week appointment.

## 2017-08-04 NOTE — Telephone Encounter (Signed)
-----   Message from Wyline Mood, MD sent at 08/04/2017  8:51 AM EDT ----- Regarding: RE: Diarrhea 1. Trial of probiotics- give her the samples we have 2. Ask her to see me back in 4 weeks, if still not better will need a colonoscopy    ----- Message ----- From: Ethlyn Gallery, CMA Sent: 08/03/2017   3:16 PM To: Ethlyn Gallery, CMA, Wyline Mood, MD Subject: Diarrhea                                       The patient has completed the antibiotics and labs came back clear.   She still has severe diarrhea and didn't know if Imodium was a long term solution.  Please advise.

## 2017-09-01 ENCOUNTER — Ambulatory Visit: Payer: Managed Care, Other (non HMO) | Admitting: Gastroenterology

## 2017-12-25 ENCOUNTER — Other Ambulatory Visit: Payer: Self-pay | Admitting: Physician Assistant

## 2017-12-25 DIAGNOSIS — F3281 Premenstrual dysphoric disorder: Secondary | ICD-10-CM

## 2018-01-05 ENCOUNTER — Ambulatory Visit: Payer: Managed Care, Other (non HMO) | Admitting: Physician Assistant

## 2018-01-05 ENCOUNTER — Encounter: Payer: Self-pay | Admitting: Physician Assistant

## 2018-01-05 VITALS — BP 90/60 | HR 70 | Temp 98.6°F | Resp 16 | Wt 131.0 lb

## 2018-01-05 DIAGNOSIS — N3000 Acute cystitis without hematuria: Secondary | ICD-10-CM

## 2018-01-05 LAB — POCT URINALYSIS DIPSTICK
BILIRUBIN UA: NEGATIVE
Glucose, UA: NEGATIVE
Ketones, UA: NEGATIVE
Nitrite, UA: NEGATIVE
PH UA: 7 (ref 5.0–8.0)
Protein, UA: NEGATIVE
Spec Grav, UA: 1.01 (ref 1.010–1.025)
Urobilinogen, UA: 0.2 E.U./dL

## 2018-01-05 MED ORDER — PHENAZOPYRIDINE HCL 200 MG PO TABS: 200.0000 mg | ORAL_TABLET | Freq: Three times a day (TID) | ORAL | 0 refills | Status: DC | PRN

## 2018-01-05 MED ORDER — SULFAMETHOXAZOLE-TRIMETHOPRIM 800-160 MG PO TABS: 1.0000 | ORAL_TABLET | Freq: Two times a day (BID) | ORAL | 0 refills | Status: DC

## 2018-01-05 NOTE — Patient Instructions (Signed)

## 2018-01-05 NOTE — Progress Notes (Signed)
Patient: Debbie JasperLeah Gorey Female    DOB: [birth year]   27 y.o.   MRN: [id number] Visit Date: 01/05/2018  Today's Provider: Margaretann LovelessJennifer M Teosha Casso, PA-C   Chief Complaint  Patient presents with  . Urinary Tract Infection   Subjective:    Urinary Tract Infection   This is a new problem. The current episode started in the past 7 days. The problem occurs every urination. The problem has been gradually worsening. The quality of the pain is described as aching and stabbing. The pain is at a severity of 4/10. The pain is mild. There has been no fever. She is sexually active. There is no history of pyelonephritis. Associated symptoms include frequency and urgency. Pertinent negatives include no chills, discharge, hematuria, nausea, possible pregnancy, sweats or vomiting. She has tried nothing for the symptoms.      Allergies  Allergen Reactions  . Other Hives    Other reaction(s): Eczema     Current Outpatient Medications:  .  escitalopram (LEXAPRO) 20 MG tablet, TAKE 1 TABLET BY MOUTH  DAILY, Disp: 90 tablet, Rfl: 1 .  levonorgestrel (MIRENA) 20 MCG/24HR IUD, 1 each by Intrauterine route once., Disp: , Rfl:  .  triamcinolone cream (KENALOG) 0.5 %, Reported on 04/09/2016, Disp: , Rfl: 0  Review of Systems  Constitutional: Negative for chills and fever.  Respiratory: Negative for cough, chest tightness and shortness of breath.   Cardiovascular: Negative for chest pain, palpitations and leg swelling.  Gastrointestinal: Positive for abdominal pain. Negative for nausea and vomiting.  Genitourinary: Positive for dysuria, frequency, urgency and vaginal pain. Negative for hematuria, vaginal bleeding and vaginal discharge.    Social History   Tobacco Use  . Smoking status: Never Smoker  . Smokeless tobacco: Never Used  Substance Use Topics  . Alcohol use: Yes    Alcohol/week: 0.0 oz    Comment: OCCASIONALLY   Objective:   BP 90/60 (BP Location: Left Arm, Patient Position: Sitting, Cuff  Size: Normal)   Pulse 70   Temp 98.6 F (37 C) (Oral)   Resp 16   Wt 131 lb (59.4 kg)   SpO2 97%   BMI 22.49 kg/m    Physical Exam  Constitutional: She is oriented to person, place, and time. She appears well-developed and well-nourished. No distress.  Cardiovascular: Normal rate, regular rhythm and normal heart sounds. Exam reveals no gallop and no friction rub.  No murmur heard. Pulmonary/Chest: Effort normal and breath sounds normal. No respiratory distress. She has no wheezes. She has no rales.  Abdominal: Soft. Normal appearance and bowel sounds are normal. She exhibits no distension and no mass. There is no hepatosplenomegaly. There is tenderness in the suprapubic area. There is no rebound, no guarding and no CVA tenderness.  Neurological: She is alert and oriented to person, place, and time.  Skin: Skin is warm and dry. She is not diaphoretic.       Assessment & Plan:     1. Acute cystitis without hematuria Worsening symptoms. UA positive. Will treat empirically with Bactrim as below. Pyridium given for spasm. Continue to push fluids. Urine sent for culture. Will follow up pending C&S results. She is to call if symptoms do not improve or if they worsen.  - POCT urinalysis dipstick - Urine Culture - phenazopyridine (PYRIDIUM) 200 MG tablet; Take 1 tablet (200 mg total) by mouth 3 (three) times daily as needed for pain.  Dispense: 10 tablet; Refill: 0 - sulfamethoxazole-trimethoprim (BACTRIM DS,SEPTRA DS) 800-160  MG tablet; Take 1 tablet by mouth 2 (two) times daily.  Dispense: 14 tablet; Refill: 0       Margaretann Loveless, PA-C  [facility]

## 2018-01-07 LAB — URINE CULTURE

## 2018-01-09 ENCOUNTER — Other Ambulatory Visit: Payer: Self-pay

## 2018-01-09 DIAGNOSIS — N3000 Acute cystitis without hematuria: Secondary | ICD-10-CM

## 2018-01-09 MED ORDER — PHENAZOPYRIDINE HCL 200 MG PO TABS: 200.0000 mg | ORAL_TABLET | Freq: Three times a day (TID) | ORAL | 0 refills | Status: DC | PRN

## 2018-01-09 MED ORDER — CIPROFLOXACIN HCL 500 MG PO TABS: 500.0000 mg | ORAL_TABLET | Freq: Two times a day (BID) | ORAL | 0 refills | Status: DC

## 2018-01-09 NOTE — Telephone Encounter (Signed)
-----   Message from Margaretann LovelessJennifer M Burnette, PA-C sent at 01/09/2018  9:28 AM EDT ----- Urine culture positive for e.coli. Resistant to bactrim unfortunately. Will send in cipro.

## 2018-01-09 NOTE — Addendum Note (Signed)
Addended by: Margaretann LovelessBURNETTE, JENNIFER M on: 01/09/2018 09:29 AM   Modules accepted: Orders

## 2018-01-09 NOTE — Telephone Encounter (Signed)
Pt advised.  [facility] [address]  She would also like a refill on Pyridium.    Thanks,    -Vernona RiegerLaura

## 2018-01-24 ENCOUNTER — Encounter: Payer: Self-pay | Admitting: Physician Assistant

## 2018-01-24 ENCOUNTER — Ambulatory Visit: Payer: Managed Care, Other (non HMO) | Admitting: Physician Assistant

## 2018-01-24 VITALS — BP 106/62 | HR 72 | Temp 98.5°F | Resp 16 | Wt 129.0 lb

## 2018-01-24 DIAGNOSIS — M26629 Arthralgia of temporomandibular joint, unspecified side: Secondary | ICD-10-CM | POA: Diagnosis not present

## 2018-01-24 NOTE — Patient Instructions (Signed)
Temporomandibular Joint Syndrome Temporomandibular joint (TMJ) syndrome is a condition that affects the joints between your jaw and your skull. The TMJs are located near your ears and allow your jaw to open and close. These joints and the nearby muscles are involved in all movements of the jaw. People with TMJ syndrome have pain in the area of these joints and muscles. Chewing, biting, or other movements of the jaw can be difficult or painful. TMJ syndrome can be caused by various things. In many cases, the condition is mild and goes away within a few weeks. For some people, the condition can become a long-term problem. What are the causes? Possible causes of TMJ syndrome include:  Grinding your teeth or clenching your jaw. Some people do this when they are under stress.  Arthritis.  Injury to the jaw.  Head or neck injury.  Teeth or dentures that are not aligned well.  In some cases, the cause of TMJ syndrome may not be known. What are the signs or symptoms? The most common symptom is an aching pain on the side of the head in the area of the TMJ. Other symptoms may include:  Pain when moving your jaw, such as when chewing or biting.  Being unable to open your jaw all the way.  Making a clicking sound when you open your mouth.  Headache.  Earache.  Neck or shoulder pain.  How is this diagnosed? Diagnosis can usually be made based on your symptoms, your medical history, and a physical exam. Your health care provider may check the range of motion of your jaw. Imaging tests, such as X-rays or an MRI, are sometimes done. You may need to see your dentist to determine if your teeth and jaw are lined up correctly. How is this treated? TMJ syndrome often goes away on its own. If treatment is needed, the options may include:  Eating soft foods and applying ice or heat.  Medicines to relieve pain or inflammation.  Medicines to relax the muscles.  A splint, bite plate, or mouthpiece  to prevent teeth grinding or jaw clenching.  Relaxation techniques or counseling to help reduce stress.  Transcutaneous electrical nerve stimulation (TENS). This helps to relieve pain by applying an electrical current through the skin.  Acupuncture. This is sometimes helpful to relieve pain.  Jaw surgery. This is rarely needed.  Follow these instructions at home:  Take medicines only as directed by your health care provider.  Eat a soft diet if you are having trouble chewing.  Apply ice to the painful area. ? Put ice in a plastic bag. ? Place a towel between your skin and the bag. ? Leave the ice on for 20 minutes, 2-3 times a day.  Apply a warm compress to the painful area as directed.  Massage your jaw area and perform any jaw stretching exercises as recommended by your health care provider.  If you were given a mouthpiece or bite plate, wear it as directed.  Avoid foods that require a lot of chewing. Do not chew gum.  Keep all follow-up visits as directed by your health care provider. This is important. Contact a health care provider if:  You are having trouble eating.  You have new or worsening symptoms. Get help right away if:  Your jaw locks open or closed. This information is not intended to replace advice given to you by your health care provider. Make sure you discuss any questions you have with your health care provider. Document   Released: 07/13/2001 Document Revised: 06/17/2016 Document Reviewed: 05/23/2014 Elsevier Interactive Patient Education  2018 Elsevier Inc.  

## 2018-01-24 NOTE — Progress Notes (Signed)
Patient: Debbie Kidd Female    DOB: 10/24/1991   27 y.o.   MRN: [id number] Visit Date: 01/24/2018  Today's Provider: Trey SailorsAdriana M Pollak, PA-C   Chief Complaint  Patient presents with  . Jaw Pain    Started this afternoon while eating lunch   Subjective:    Debbie Kidd is a 27 y/o woman presenting today with jaw pain ongoing since lunch time. She says she was eating a beet when she felt her jaw slip. She endorses pain around her left TMJ that is not constant. She is able to open and close her mouth, as well as talk. She has no drooling. She has had orthodontics before and does not know of any jaw misalignment. This has never happened before.    Mouth Injury Primary symptoms do not include shortness of breath. The symptoms began 1 to 2 hours ago. The symptoms are unchanged. The symptoms occur constantly.  Additional symptoms include: jaw pain. Additional symptoms do not include: gum swelling, gum tenderness, purulent gums and facial swelling. Associated symptoms comments: Pt reports her "bite feels off".       Allergies  Allergen Reactions  . Other Hives    Other reaction(s): Eczema     Current Outpatient Medications:  .  escitalopram (LEXAPRO) 20 MG tablet, TAKE 1 TABLET BY MOUTH  DAILY, Disp: 90 tablet, Rfl: 1 .  levonorgestrel (MIRENA) 20 MCG/24HR IUD, 1 each by Intrauterine route once., Disp: , Rfl:  .  triamcinolone cream (KENALOG) 0.5 %, Reported on 04/09/2016, Disp: , Rfl: 0 .  ciprofloxacin (CIPRO) 500 MG tablet, Take 1 tablet (500 mg total) by mouth 2 (two) times daily., Disp: 10 tablet, Rfl: 0 .  phenazopyridine (PYRIDIUM) 200 MG tablet, Take 1 tablet (200 mg total) by mouth 3 (three) times daily as needed for pain., Disp: 10 tablet, Rfl: 0  Review of Systems  Constitutional: Negative.   HENT: Negative for facial swelling.   Respiratory: Negative for chest tightness and shortness of breath.   Musculoskeletal: Positive for arthralgias. Negative for back pain, gait  problem, joint swelling, myalgias, neck pain and neck stiffness.    Social History   Tobacco Use  . Smoking status: Never Smoker  . Smokeless tobacco: Never Used  Substance Use Topics  . Alcohol use: Yes    Alcohol/week: 0.0 oz    Comment: OCCASIONALLY   Objective:   BP 106/62 (BP Location: Right Arm, Patient Position: Sitting, Cuff Size: Normal)   Pulse 72   Temp 98.5 F (36.9 C) (Oral)   Resp 16   Wt 129 lb (58.5 kg)   BMI 22.14 kg/m  Vitals:   01/24/18 1448  BP: 106/62  Pulse: 72  Resp: 16  Temp: 98.5 F (36.9 C)  TempSrc: Oral  Weight: 129 lb (58.5 kg)     Physical Exam  Constitutional: She is oriented to person, place, and time. She appears well-developed and well-nourished. No distress.  HENT:  There is some crepitus in her right TMJ with ROM. There is no preauricular depression or muscle spasm. Patient is able to open and closer her mouth easily. Some pain with deep palpation of her left TMJ.   Neurological: She is alert and oriented to person, place, and time.  Skin: Skin is warm and dry.  Psychiatric: She has a normal mood and affect. Her behavior is normal.        Assessment & Plan:     1. TMJ pain dysfunction syndrome  Do not appreciate dislocation today. Patient is well appearing in no distress, able to open and close mouth freely, manages oral secretions. Possible patient may have experienced subluxation during mealtime. If this continues to happen and is bothersome to patient, she may do well to see an oral Careers adviser.  Return if symptoms worsen or fail to improve.  The entirety of the information documented in the History of Present Illness, Review of Systems and Physical Exam were personally obtained by me. Portions of this information were initially documented by Kavin Leech, CMA and reviewed by me for thoroughness and accuracy.          Trey Sailors, PA-C  [facility]

## 2018-06-29 ENCOUNTER — Encounter: Payer: Self-pay | Admitting: Physician Assistant

## 2018-06-29 ENCOUNTER — Ambulatory Visit: Payer: Managed Care, Other (non HMO) | Admitting: Physician Assistant

## 2018-06-29 VITALS — BP 100/60 | HR 73 | Temp 98.0°F | Resp 16 | Wt 138.2 lb

## 2018-06-29 DIAGNOSIS — L2084 Intrinsic (allergic) eczema: Secondary | ICD-10-CM

## 2018-06-29 DIAGNOSIS — G43109 Migraine with aura, not intractable, without status migrainosus: Secondary | ICD-10-CM

## 2018-06-29 MED ORDER — TRIAMCINOLONE ACETONIDE 0.5 % EX CREA: TOPICAL_CREAM | Freq: Two times a day (BID) | CUTANEOUS | 0 refills | Status: AC

## 2018-06-29 MED ORDER — SUMATRIPTAN SUCCINATE 25 MG PO TABS: 25.0000 mg | ORAL_TABLET | ORAL | 0 refills | Status: AC | PRN

## 2018-06-29 NOTE — Patient Instructions (Signed)
Magnesium Oxide 500-1000mg  daily.  Sumatriptan tablets What is this medicine? SUMATRIPTAN (soo ma TRIP tan) is used to treat migraines with or without aura. An aura is a strange feeling or visual disturbance that warns you of an attack. It is not used to prevent migraines. This medicine may be used for other purposes; ask your health care provider or pharmacist if you have questions. COMMON BRAND NAME(S): Imitrex, Migraine Pack What should I tell my health care provider before I take this medicine? They need to know if you have any of these conditions: -circulation problems in fingers and toes -diabetes -heart disease -high blood pressure -high cholesterol -history of irregular heartbeat -history of stroke -kidney disease -liver disease -postmenopausal or surgical removal of uterus and ovaries -seizures -smoke tobacco -stomach or intestine problems -an unusual or allergic reaction to sumatriptan, other medicines, foods, dyes, or preservatives -pregnant or trying to get pregnant -breast-feeding How should I use this medicine? Take this medicine by mouth with a glass of water. Follow the directions on the prescription label. This medicine is taken at the first symptoms of a migraine. It is not for everyday use. If your migraine headache returns after one dose, you can take another dose as directed. You must leave at least 2 hours between doses, and do not take more than 100 mg as a single dose. Do not take more than 200 mg total in any 24 hour period. If there is no improvement at all after the first dose, do not take a second dose without talking to your doctor or health care professional. Do not take your medicine more often than directed. Talk to your pediatrician regarding the use of this medicine in children. Special care may be needed. Overdosage: If you think you have taken too much of this medicine contact a poison control center or emergency room at once. NOTE: This medicine is  only for you. Do not share this medicine with others. What if I miss a dose? This does not apply; this medicine is not for regular use. What may interact with this medicine? Do not take this medicine with any of the following medicines: -cocaine -ergot alkaloids like dihydroergotamine, ergonovine, ergotamine, methylergonovine -feverfew -MAOIs like Carbex, Eldepryl, Marplan, Nardil, and Parnate -other medicines for migraine headache like almotriptan, eletriptan, frovatriptan, naratriptan, rizatriptan, zolmitriptan -tryptophan This medicine may also interact with the following medications: -certain medicines for depression, anxiety, or psychotic disturbances This list may not describe all possible interactions. Give your health care provider a list of all the medicines, herbs, non-prescription drugs, or dietary supplements you use. Also tell them if you smoke, drink alcohol, or use illegal drugs. Some items may interact with your medicine. What should I watch for while using this medicine? Only take this medicine for a migraine headache. Take it if you get warning symptoms or at the start of a migraine attack. It is not for regular use to prevent migraine attacks. You may get drowsy or dizzy. Do not drive, use machinery, or do anything that needs mental alertness until you know how this medicine affects you. To reduce dizzy or fainting spells, do not sit or stand up quickly, especially if you are an older patient. Alcohol can increase drowsiness, dizziness and flushing. Avoid alcoholic drinks. Smoking cigarettes may increase the risk of heart-related side effects from using this medicine. If you take migraine medicines for 10 or more days a month, your migraines may get worse. Keep a diary of headache days and medicine use. Contact  your healthcare professional if your migraine attacks occur more frequently. What side effects may I notice from receiving this medicine? Side effects that you should  report to your doctor or health care professional as soon as possible: -allergic reactions like skin rash, itching or hives, swelling of the face, lips, or tongue -bloody or watery diarrhea -hallucination, loss of contact with reality -pain, tingling, numbness in the face, hands, or feet -seizures -signs and symptoms of a blood clot such as breathing problems; changes in vision; chest pain; severe, sudden headache; pain, swelling, warmth in the leg; trouble speaking; sudden numbness or weakness of the face, arm, or leg -signs and symptoms of a dangerous change in heartbeat or heart rhythm like chest pain; dizziness; fast or irregular heartbeat; palpitations, feeling faint or lightheaded; falls; breathing problems -signs and symptoms of a stroke like changes in vision; confusion; trouble speaking or understanding; severe headaches; sudden numbness or weakness of the face, arm, or leg; trouble walking; dizziness; loss of balance or coordination -stomach pain Side effects that usually do not require medical attention (report to your doctor or health care professional if they continue or are bothersome): -changes in taste -facial flushing -headache -muscle cramps -muscle pain -nausea, vomiting -weak or tired This list may not describe all possible side effects. Call your doctor for medical advice about side effects. You may report side effects to FDA at 1-800-FDA-1088. Where should I keep my medicine? Keep out of the reach of children. Store at room temperature between 2 and 30 degrees C (36 and 86 degrees F). Throw away any unused medicine after the expiration date. NOTE: This sheet is a summary. It may not cover all possible information. If you have questions about this medicine, talk to your doctor, pharmacist, or health care provider.  2018 Elsevier/Gold Standard (2015-11-20 12:38:23)

## 2018-06-29 NOTE — Progress Notes (Signed)
Patient: Debbie Kidd Female    DOB: Mar 15, 1991   27 y.o.   MRN: [id number] Visit Date: 06/29/2018  Today's Provider: Margaretann Loveless, PA-C   Chief Complaint  Patient presents with  . Migraine   Subjective:    Migraine   This is a new problem. The current episode started 1 to 4 weeks ago (She reports she had one last Monday and last Thursday.). The problem occurs constantly. The problem has been gradually worsening. The pain is located in the frontal region. The pain radiates to the face. The quality of the pain is described as aching and sharp. The pain is at a severity of 5/10. The pain is moderate. Associated symptoms include blurred vision ("sometimes"), eye pain, nausea and photophobia. Pertinent negatives include no abdominal pain, dizziness, facial sweating or vomiting. The symptoms are aggravated by bright light ("Bending"). She has tried Excedrin and darkened room for the symptoms. The treatment provided no relief. Her past medical history is significant for migraine headaches.      Allergies  Allergen Reactions  . Other Hives    Other reaction(s): Eczema     Current Outpatient Medications:  .  escitalopram (LEXAPRO) 20 MG tablet, TAKE 1 TABLET BY MOUTH  DAILY, Disp: 90 tablet, Rfl: 1 .  levonorgestrel (MIRENA) 20 MCG/24HR IUD, 1 each by Intrauterine route once., Disp: , Rfl:  .  ciprofloxacin (CIPRO) 500 MG tablet, Take 1 tablet (500 mg total) by mouth 2 (two) times daily. (Patient not taking: Reported on 06/29/2018), Disp: 10 tablet, Rfl: 0 .  triamcinolone cream (KENALOG) 0.5 %, Reported on 04/09/2016, Disp: , Rfl: 0  Review of Systems  Constitutional: Negative.   Eyes: Positive for blurred vision ("sometimes"), photophobia and pain.  Respiratory: Negative.   Cardiovascular: Negative.   Gastrointestinal: Positive for nausea. Negative for abdominal pain and vomiting.  Neurological: Positive for headaches. Negative for dizziness.    Social History   Tobacco  Use  . Smoking status: Never Smoker  . Smokeless tobacco: Never Used  Substance Use Topics  . Alcohol use: Yes    Alcohol/week: 0.0 standard drinks    Comment: OCCASIONALLY   Objective:   BP 100/60 (BP Location: Left Arm, Patient Position: Sitting, Cuff Size: Normal)   Pulse 73   Temp 98 F (36.7 C) (Oral)   Resp 16   Wt 138 lb 3.2 oz (62.7 kg)   SpO2 98%   BMI 23.72 kg/m  Vitals:   06/29/18 0938  BP: 100/60  Pulse: 73  Resp: 16  Temp: 98 F (36.7 C)  TempSrc: Oral  SpO2: 98%  Weight: 138 lb 3.2 oz (62.7 kg)     Physical Exam  Constitutional: She is oriented to person, place, and time. She appears well-developed and well-nourished. No distress.  Neck: Normal range of motion. Neck supple.  Cardiovascular: Normal rate, regular rhythm and normal heart sounds. Exam reveals no gallop and no friction rub.  No murmur heard. Pulmonary/Chest: Effort normal and breath sounds normal. No respiratory distress. She has no wheezes. She has no rales.  Neurological: She is alert and oriented to person, place, and time. No cranial nerve deficit or sensory deficit. She exhibits normal muscle tone. Coordination normal.  Skin: Skin is warm and dry. She is not diaphoretic.  Vitals reviewed.     Office Visit from 06/29/2018 in [facility]  PHQ-9 Total Score  10        Assessment & Plan:  1. Migraine with aura and without status migrainosus, not intractable Will start magnesium oxide 500-1000mg  daily for migraines. Imitrex will be prescribed for breakthrough migraines. She is to call the office or send a mychart message if symptoms do not improve. Discussed using propranolol or topamax next if magnesium fails.  - SUMAtriptan (IMITREX) 25 MG tablet; Take 1 tablet (25 mg total) by mouth every 2 (two) hours as needed for migraine. No more than 200mg  /24 hr  Dispense: 10 tablet; Refill: 0  2. Intrinsic eczema Stable. Diagnosis pulled for medication refill. Continue current  medical treatment plan. - triamcinolone cream (KENALOG) 0.5 %; Apply topically 2 (two) times daily.  Dispense: 454 g; Refill: 0       Margaretann LovelessJennifer M Phoenyx Melka, PA-C  [facility]

## 2018-08-07 ENCOUNTER — Encounter: Payer: Self-pay | Admitting: Physician Assistant

## 2018-08-07 ENCOUNTER — Ambulatory Visit (INDEPENDENT_AMBULATORY_CARE_PROVIDER_SITE_OTHER): Payer: Managed Care, Other (non HMO) | Admitting: Physician Assistant

## 2018-08-07 VITALS — BP 98/60 | HR 79 | Temp 98.7°F | Resp 16 | Ht 64.0 in | Wt 136.0 lb

## 2018-08-07 DIAGNOSIS — Z2821 Immunization not carried out because of patient refusal: Secondary | ICD-10-CM | POA: Diagnosis not present

## 2018-08-07 DIAGNOSIS — F3281 Premenstrual dysphoric disorder: Secondary | ICD-10-CM

## 2018-08-07 DIAGNOSIS — Z124 Encounter for screening for malignant neoplasm of cervix: Secondary | ICD-10-CM | POA: Diagnosis not present

## 2018-08-07 DIAGNOSIS — Z0000 Encounter for general adult medical examination without abnormal findings: Secondary | ICD-10-CM | POA: Diagnosis not present

## 2018-08-07 MED ORDER — ESCITALOPRAM OXALATE 20 MG PO TABS: 20.0000 mg | ORAL_TABLET | Freq: Every day | ORAL | 1 refills | Status: DC

## 2018-08-07 NOTE — Progress Notes (Signed)
Patient: Debbie Kidd, Female    DOB: Apr 19, 1991, 27 y.o.   MRN: [id number] Visit Date: 08/07/2018  Today's Provider: Margaretann Loveless, PA-C   Chief Complaint  Patient presents with  . Annual Exam   Subjective:    Annual physical exam Debbie Kidd is a 27 y.o. female who presents today for health maintenance and complete physical. She feels well. She reports exercising some 2 times a week, bootcamp. She reports she is sleeping well. ----------------------------------------------------------------- Flu Vaccine: Patient reports she will get it tomorrow at work.She works for [employer].  Review of Systems  Constitutional: Negative.   HENT: Negative.   Eyes: Negative.   Respiratory: Negative.   Cardiovascular: Negative.   Gastrointestinal: Negative.   Endocrine: Negative.   Genitourinary: Negative.   Musculoskeletal: Negative.   Skin: Negative.   Allergic/Immunologic: Negative.   Neurological: Negative.   Hematological: Negative.   Psychiatric/Behavioral: Negative.     Social History      She  reports that she has never smoked. She has never used smokeless tobacco. She reports that she drinks alcohol. She reports that she does not use drugs.       Social History   Socioeconomic History  . Marital status: Single    Spouse name: Not on file  . Number of children: Not on file  . Years of education: Not on file  . Highest education level: Not on file  Occupational History  . Not on file  Social Needs  . Financial resource strain: Not on file  . Food insecurity:    Worry: Not on file    Inability: Not on file  . Transportation needs:    Medical: Not on file    Non-medical: Not on file  Tobacco Use  . Smoking status: Never Smoker  . Smokeless tobacco: Never Used  Substance and Sexual Activity  . Alcohol use: Yes    Alcohol/week: 0.0 standard drinks    Comment: OCCASIONALLY  . Drug use: No  . Sexual activity: Yes    Birth control/protection: IUD    Lifestyle  . Physical activity:    Days per week: Not on file    Minutes per session: Not on file  . Stress: Not on file  Relationships  . Social connections:    Talks on phone: Not on file    Gets together: Not on file    Attends religious service: Not on file    Active member of club or organization: Not on file    Attends meetings of clubs or organizations: Not on file    Relationship status: Not on file  Other Topics Concern  . Not on file  Social History Narrative  . Not on file    Past Medical History:  Diagnosis Date  . Ovarian cyst 2012   No surgery needed     Patient Active Problem List   Diagnosis Date Noted  . PMDD (premenstrual dysphoric disorder) 04/09/2016  . Dermatitis, eczematoid 06/09/2015  . Jaundice 06/09/2015  . Brash 06/09/2015  . Migraine 06/09/2015    Past Surgical History:  Procedure Laterality Date  . NO PAST SURGERIES      Family History        Family Status  Relation Name Status  . Mother  Alive  . Father  Alive  . Sister 1 Alive  . Sister 2 Alive  . MGM  (Not Specified)  . PGM  (Not Specified)  . PGF  (Not Specified)  Her family history includes Alzheimer's disease in her paternal grandmother; CVA in her paternal grandfather; Cancer in her maternal grandmother; Healthy in her father, mother, sister, and sister.      Allergies  Allergen Reactions  . Other Hives    Other reaction(s): Eczema     Current Outpatient Medications:  .  escitalopram (LEXAPRO) 20 MG tablet, TAKE 1 TABLET BY MOUTH  DAILY, Disp: 90 tablet, Rfl: 1 .  levonorgestrel (MIRENA) 20 MCG/24HR IUD, 1 each by Intrauterine route once., Disp: , Rfl:  .  triamcinolone cream (KENALOG) 0.5 %, Apply topically 2 (two) times daily., Disp: 454 g, Rfl: 0 .  SUMAtriptan (IMITREX) 25 MG tablet, Take 1 tablet (25 mg total) by mouth every 2 (two) hours as needed for migraine. No more than 200mg  /24 hr (Patient not taking: Reported on 08/07/2018), Disp: 10 tablet, Rfl:  0   Patient Care Team: Margaretann Loveless, PA-C as PCP - General (Physician Assistant)      Objective:   Vitals: BP 98/60 (BP Location: Left Arm, Patient Position: Sitting, Cuff Size: Normal)   Pulse 79   Temp 98.7 F (37.1 C) (Oral)   Resp 16   Ht 5\' 4"  (1.626 m)   Wt 136 lb (61.7 kg)   SpO2 98%   BMI 23.34 kg/m    Vitals:   08/07/18 1511  BP: 98/60  Pulse: 79  Resp: 16  Temp: 98.7 F (37.1 C)  TempSrc: Oral  SpO2: 98%  Weight: 136 lb (61.7 kg)  Height: 5\' 4"  (1.626 m)     Physical Exam  Constitutional: She is oriented to person, place, and time. She appears well-developed and well-nourished. No distress.  HENT:  Head: Normocephalic and atraumatic.  Right Ear: Hearing, tympanic membrane, external ear and ear canal normal.  Left Ear: Hearing, tympanic membrane, external ear and ear canal normal.  Nose: Nose normal.  Mouth/Throat: Uvula is midline, oropharynx is clear and moist and mucous membranes are normal. No oropharyngeal exudate.  Eyes: Pupils are equal, round, and reactive to light. Conjunctivae and EOM are normal. Right eye exhibits no discharge. Left eye exhibits no discharge. No scleral icterus.  Neck: Normal range of motion. Neck supple. No JVD present. Carotid bruit is not present. No tracheal deviation present. No thyromegaly present.  Cardiovascular: Normal rate, regular rhythm, normal heart sounds and intact distal pulses. Exam reveals no gallop and no friction rub.  No murmur heard. Pulmonary/Chest: Effort normal and breath sounds normal. No respiratory distress. She has no wheezes. She has no rales. She exhibits no tenderness. Right breast exhibits no inverted nipple, no mass, no nipple discharge, no skin change and no tenderness. Left breast exhibits no inverted nipple, no mass, no nipple discharge, no skin change and no tenderness. No breast tenderness, discharge or bleeding. Breasts are symmetrical.  Abdominal: Soft. Bowel sounds are normal. She  exhibits no distension and no mass. There is no tenderness. There is no rebound and no guarding. Hernia confirmed negative in the right inguinal area and confirmed negative in the left inguinal area.  Genitourinary: Rectum normal, vagina normal and uterus normal. No breast tenderness, discharge or bleeding. Pelvic exam was performed with patient supine. There is no rash, tenderness, lesion or injury on the right labia. There is no rash, tenderness, lesion or injury on the left labia. Cervix exhibits no motion tenderness, no discharge and no friability. Right adnexum displays no mass, no tenderness and no fullness. Left adnexum displays no mass, no tenderness and no  fullness. No erythema, tenderness or bleeding in the vagina. No signs of injury around the vagina. No vaginal discharge (yellow,white thick mucous discharge) found.    Musculoskeletal: Normal range of motion. She exhibits no edema or tenderness.  Lymphadenopathy:    She has no cervical adenopathy.       Right: No inguinal adenopathy present.       Left: No inguinal adenopathy present.  Neurological: She is alert and oriented to person, place, and time. She has normal reflexes. No cranial nerve deficit. Coordination normal.  Skin: Skin is warm and dry. No rash noted. She is not diaphoretic.  Psychiatric: She has a normal mood and affect. Her behavior is normal. Judgment and thought content normal.  Vitals reviewed.    Depression Screen PHQ 2/9 Scores 06/29/2018 06/21/2017  PHQ - 2 Score 4 0  PHQ- 9 Score 10 0      Assessment & Plan:     Routine Health Maintenance and Physical Exam  Exercise Activities and Dietary recommendations Goals   None      There is no immunization history on file for this patient.  Health Maintenance  Topic Date Due  . TETANUS/TDAP  06/20/2009  . PAP SMEAR  01/03/2017  . INFLUENZA VACCINE  06/01/2018  . HIV Screening  Completed     Discussed health benefits of physical activity, and  encouraged her to engage in regular exercise appropriate for her age and condition.    1. Annual physical exam Normal physical exam today. Will check labs as below and f/u pending lab results. If labs are stable and WNL she will not need to have these rechecked for one year at her next annual physical exam. She is to call the office in the meantime if she has any acute issue, questions or concerns. - Comprehensive metabolic panel - CBC with Differential/Platelet - Hemoglobin A1c - Lipid panel - TSH  2. Cervical cancer screening Pap collected today. Will send as below and f/u pending results. - Pap IG, CT/NG w/ reflex HPV when ASC-U  3. PMDD (premenstrual dysphoric disorder) Stable. Diagnosis pulled for medication refill. Continue current medical treatment plan. - escitalopram (LEXAPRO) 20 MG tablet; Take 1 tablet (20 mg total) by mouth daily.  Dispense: 90 tablet; Refill: 1  4. Influenza vaccination declined Having tomorrow through her employer.  --------------------------------------------------------------------    Margaretann Loveless, PA-C  [facility]

## 2018-08-07 NOTE — Patient Instructions (Signed)

## 2018-08-08 LAB — COMPREHENSIVE METABOLIC PANEL
ALT: 11 IU/L (ref 0–32)
AST: 15 IU/L (ref 0–40)
Albumin/Globulin Ratio: 1.5 (ref 1.2–2.2)
Albumin: 4.4 g/dL (ref 3.5–5.5)
Alkaline Phosphatase: 61 IU/L (ref 39–117)
BUN/Creatinine Ratio: 19 (ref 9–23)
BUN: 18 mg/dL (ref 6–20)
Bilirubin Total: 0.7 mg/dL (ref 0.0–1.2)
CO2: 20 mmol/L (ref 20–29)
CREATININE: 0.94 mg/dL (ref 0.57–1.00)
Calcium: 9.2 mg/dL (ref 8.7–10.2)
Chloride: 104 mmol/L (ref 96–106)
GFR calc Af Amer: 95 mL/min/{1.73_m2} (ref >59)
GFR, EST NON AFRICAN AMERICAN: 83 mL/min/{1.73_m2} (ref >59)
GLOBULIN, TOTAL: 2.9 g/dL (ref 1.5–4.5)
Glucose: 82 mg/dL (ref 65–99)
Potassium: 4.2 mmol/L (ref 3.5–5.2)
Sodium: 138 mmol/L (ref 134–144)
Total Protein: 7.3 g/dL (ref 6.0–8.5)

## 2018-08-08 LAB — LIPID PANEL
CHOLESTEROL TOTAL: 148 mg/dL (ref 100–199)
Chol/HDL Ratio: 3 ratio (ref 0.0–4.4)
HDL: 50 mg/dL (ref >39)
LDL CALC: 84 mg/dL (ref 0–99)
TRIGLYCERIDES: 71 mg/dL (ref 0–149)
VLDL CHOLESTEROL CAL: 14 mg/dL (ref 5–40)

## 2018-08-08 LAB — CBC WITH DIFFERENTIAL/PLATELET
BASOS: 1 %
Basophils Absolute: 0.1 10*3/uL (ref 0.0–0.2)
EOS (ABSOLUTE): 0.2 10*3/uL (ref 0.0–0.4)
EOS: 2 %
HEMATOCRIT: 38 % (ref 34.0–46.6)
Hemoglobin: 12.4 g/dL (ref 11.1–15.9)
IMMATURE GRANS (ABS): 0 10*3/uL (ref 0.0–0.1)
Immature Granulocytes: 0 %
Lymphocytes Absolute: 3.1 10*3/uL (ref 0.7–3.1)
Lymphs: 36 %
MCH: 30.3 pg (ref 26.6–33.0)
MCHC: 32.6 g/dL (ref 31.5–35.7)
MCV: 93 fL (ref 79–97)
Monocytes Absolute: 0.6 10*3/uL (ref 0.1–0.9)
Monocytes: 6 %
Neutrophils Absolute: 4.7 10*3/uL (ref 1.4–7.0)
Neutrophils: 55 %
Platelets: 381 10*3/uL (ref 150–450)
RBC: 4.09 x10E6/uL (ref 3.77–5.28)
RDW: 11.7 % — ABNORMAL LOW (ref 12.3–15.4)
WBC: 8.6 10*3/uL (ref 3.4–10.8)

## 2018-08-08 LAB — HEMOGLOBIN A1C
Est. average glucose Bld gHb Est-mCnc: 97 mg/dL
Hgb A1c MFr Bld: 5 % (ref 4.8–5.6)

## 2018-08-08 LAB — TSH: TSH: 2.9 u[IU]/mL (ref 0.450–4.500)

## 2018-08-10 LAB — PAP IG, CT-NG, RFX HPV ASCU
CHLAMYDIA, NUC. ACID AMP: NEGATIVE
Gonococcus by Nucleic Acid Amp: NEGATIVE
PAP SMEAR COMMENT: 0

## 2018-11-22 ENCOUNTER — Telehealth: Payer: Self-pay

## 2018-11-22 DIAGNOSIS — N3000 Acute cystitis without hematuria: Secondary | ICD-10-CM

## 2018-11-22 MED ORDER — SULFAMETHOXAZOLE-TRIMETHOPRIM 800-160 MG PO TABS: 1.0000 | ORAL_TABLET | Freq: Two times a day (BID) | ORAL | 0 refills | Status: DC

## 2018-11-22 NOTE — Telephone Encounter (Signed)
Patient calling that she went to an urgent care this weekend and was diagnosed with a UTI. Patient was prescribed Nitrofurantoin 100mg  capsule which she started yesterday. Reports that she has developed diarrhea which is watery and is feeling dizzy and is asking if this can be side effect from the antibiotic and if she needs a different antibiotic. Patient reports that she has not taken the Macrobid this morning and is drinking plenty of fluids. Please advise.

## 2018-11-22 NOTE — Telephone Encounter (Signed)
Patient advised as directed below.  Thanks,  -Baby Stairs 

## 2018-11-22 NOTE — Telephone Encounter (Signed)
Yes it could be. Will send in bactrim. Do not take Macrobid.

## 2018-12-25 ENCOUNTER — Other Ambulatory Visit: Payer: Self-pay | Admitting: Physician Assistant

## 2018-12-25 DIAGNOSIS — F3281 Premenstrual dysphoric disorder: Secondary | ICD-10-CM

## 2019-01-23 ENCOUNTER — Ambulatory Visit: Payer: Managed Care, Other (non HMO) | Admitting: Urology

## 2019-02-22 ENCOUNTER — Ambulatory Visit: Payer: Managed Care, Other (non HMO) | Admitting: Family Medicine

## 2019-02-28 ENCOUNTER — Ambulatory Visit: Payer: Managed Care, Other (non HMO) | Admitting: Urology

## 2019-04-02 NOTE — Patient Instructions (Signed)

## 2019-04-02 NOTE — Progress Notes (Signed)
IUD Removal and Reinsertion Procedure Note  Pre-operative Diagnosis: need for contraception  Post-operative Diagnosis: normal  Indications: contraception  Procedure Details  Urine pregnancy test was done and result was negative.  The risks (including infection, bleeding, pain, and uterine perforation) and benefits of the procedure were explained to the patient and Written informed consent was obtained.    IUD strings visualied and grasp with ring forceps and easily removed with IUD intact.  Cervix cleansed with Betadine. Tenaculum applied to anterior cervix.  Uterus sounded to 8 cm. IUD inserted without difficulty. String visible and trimmed. Patient tolerated procedure well.  IUD Information: Mirena, Lot # [id number], Expiration date Apr 2022.  Condition: Stable  Complications: None  Plan:  The patient was advised to call for any fever or for prolonged or severe pain or bleeding. She was advised to use OTC analgesics as needed for mild to moderate pain.   Bacigalupo, Marzella Schlein, MD, MPH [facility]

## 2019-04-03 ENCOUNTER — Ambulatory Visit: Payer: Managed Care, Other (non HMO) | Admitting: Family Medicine

## 2019-04-03 ENCOUNTER — Encounter: Payer: Self-pay | Admitting: Family Medicine

## 2019-04-03 ENCOUNTER — Other Ambulatory Visit: Payer: Self-pay

## 2019-04-03 VITALS — BP 106/72 | HR 81 | Wt 139.0 lb

## 2019-04-03 DIAGNOSIS — Z30433 Encounter for removal and reinsertion of intrauterine contraceptive device: Secondary | ICD-10-CM

## 2019-04-03 LAB — POCT URINE PREGNANCY: Preg Test, Ur: NEGATIVE

## 2019-04-03 MED ORDER — LEVONORGESTREL 20 MCG/24HR IU IUD
INTRAUTERINE_SYSTEM | Freq: Once | INTRAUTERINE | Status: AC
  Administered 2019-04-03: 11:00:00 via INTRAUTERINE

## 2019-04-24 ENCOUNTER — Encounter: Payer: Managed Care, Other (non HMO) | Admitting: Obstetrics and Gynecology

## 2019-05-03 ENCOUNTER — Encounter: Payer: Self-pay | Admitting: Family Medicine

## 2019-05-03 ENCOUNTER — Other Ambulatory Visit: Payer: Self-pay

## 2019-05-03 ENCOUNTER — Ambulatory Visit: Payer: Managed Care, Other (non HMO) | Admitting: Family Medicine

## 2019-05-03 VITALS — BP 109/66 | HR 66 | Temp 98.9°F | Wt 142.6 lb

## 2019-05-03 DIAGNOSIS — Z30431 Encounter for routine checking of intrauterine contraceptive device: Secondary | ICD-10-CM | POA: Diagnosis not present

## 2019-05-03 NOTE — Progress Notes (Signed)
Patient: Debbie Kidd Female    DOB: 21-May-1991   27 y.o.   MRN: [id number] Visit Date: 05/03/2019  Today's Provider: Lavon Paganini, MD   Chief Complaint  Patient presents with  . IUD string check   Subjective:     HPI Patient presents today for a 4 week IUD string check. IUD was inserted on 04/03/2019. Patient denies bleeding, cramping, and pain.   Allergies  Allergen Reactions  . Other Hives    Other reaction(s): Eczema     Current Outpatient Medications:  .  escitalopram (LEXAPRO) 20 MG tablet, TAKE 1 TABLET BY MOUTH  DAILY, Disp: 90 tablet, Rfl: 1 .  levonorgestrel (MIRENA) 20 MCG/24HR IUD, 1 each by Intrauterine route once., Disp: , Rfl:  .  triamcinolone cream (KENALOG) 0.5 %, Apply topically 2 (two) times daily., Disp: 454 g, Rfl: 0 .  SUMAtriptan (IMITREX) 25 MG tablet, Take 1 tablet (25 mg total) by mouth every 2 (two) hours as needed for migraine. No more than 200mg  /24 hr (Patient not taking: Reported on 08/07/2018), Disp: 10 tablet, Rfl: 0  Review of Systems  Constitutional: Negative.   Respiratory: Negative.   Cardiovascular: Negative.   Musculoskeletal: Negative.     Social History   Tobacco Use  . Smoking status: Never Smoker  . Smokeless tobacco: Never Used  Substance Use Topics  . Alcohol use: Yes    Alcohol/week: 0.0 standard drinks    Comment: OCCASIONALLY      Objective:   BP 109/66 (BP Location: Right Arm, Patient Position: Sitting, Cuff Size: Normal)   Pulse 66   Temp 98.9 F (37.2 C) (Oral)   Wt 142 lb 9.6 oz (64.7 kg)   SpO2 96%   BMI 24.48 kg/m  Vitals:   05/03/19 1552  BP: 109/66  Pulse: 66  Temp: 98.9 F (37.2 C)  TempSrc: Oral  SpO2: 96%  Weight: 142 lb 9.6 oz (64.7 kg)     Physical Exam Vitals signs reviewed.  Constitutional:      Appearance: Normal appearance.  HENT:     Head: Normocephalic and atraumatic.  Cardiovascular:     Rate and Rhythm: Normal rate and regular rhythm.  Pulmonary:     Effort:  Pulmonary effort is normal. No respiratory distress.  Genitourinary:    Comments: GYN:  External genitalia within normal limits.  Vaginal mucosa pink, moist, normal rugae.  Nonfriable cervix without lesions, no discharge or bleeding noted on speculum exam.  IUD strings easily visualized from external os  Skin:    General: Skin is warm and dry.  Neurological:     Mental Status: She is alert and oriented to person, place, and time. Mental status is at baseline.      No results found for any visits on 05/03/19.     Assessment & Plan   1. Encounter for routine checking of intrauterine contraceptive device (IUD) -Patient 1 month status post IUD placement - She is doing well with no complaints, no bleeding, no pain, no cramping, no fevers - IUD string check performed today and strings are in appropriate position -Discussed return precautions   Return if symptoms worsen or fail to improve.   The entirety of the information documented in the History of Present Illness, Review of Systems and Physical Exam were personally obtained by me. Portions of this information were initially documented by Tiburcio Pea, CMA and reviewed by me for thoroughness and accuracy.    , Dionne Bucy, MD MPH [facility]  

## 2019-05-18 ENCOUNTER — Telehealth: Payer: Self-pay

## 2019-05-18 NOTE — Telephone Encounter (Signed)
Patient called saying that she has a non productive cough and sore throat and her employer is requesting that she have covid testing done. Should she have a virtual visit also? Please advise.  Contact number is correct. Thanks!

## 2019-05-18 NOTE — Telephone Encounter (Signed)
Called patient back and told her that we can do an E-visit for 11:20 am and I can go ahead and place the lab test for COVID to go to the testing site and get done and explained that it takes 5-7 days to get results back. Per patient she called to scheduled an appointment at the urgent care at 3pm and she is going to stick with that.

## 2019-05-18 NOTE — Telephone Encounter (Signed)
Noted  

## 2019-06-06 ENCOUNTER — Ambulatory Visit: Payer: Managed Care, Other (non HMO) | Admitting: Urology

## 2019-06-06 ENCOUNTER — Encounter: Payer: Self-pay | Admitting: Urology

## 2019-06-06 ENCOUNTER — Other Ambulatory Visit: Payer: Self-pay

## 2019-06-06 VITALS — BP 120/78 | HR 82 | Ht 64.0 in | Wt 141.0 lb

## 2019-06-06 DIAGNOSIS — R300 Dysuria: Secondary | ICD-10-CM

## 2019-06-06 LAB — URINALYSIS, COMPLETE: Specific Gravity, UA: 1.02 (ref 1.005–1.030)

## 2019-06-06 LAB — MICROSCOPIC EXAMINATION

## 2019-06-06 LAB — BLADDER SCAN AMB NON-IMAGING

## 2019-06-06 MED ORDER — URIBEL 118 MG PO CAPS: 1.0000 | ORAL_CAPSULE | Freq: Four times a day (QID) | ORAL | 1 refills | Status: AC | PRN

## 2019-06-06 MED ORDER — NITROFURANTOIN MONOHYD MACRO 100 MG PO CAPS: 100.0000 mg | ORAL_CAPSULE | ORAL | 0 refills | Status: DC | PRN

## 2019-06-06 NOTE — Progress Notes (Signed)
06/06/2019 11:11 AM   Debbie Kidd [birth year] [id number]  Referring provider: Margaretann LovelessBurnette, Jennifer M, PA-C [address] [place],  [place] [id number]  Chief Complaint  Patient presents with  . Dysuria    HPI: 28 year old female referred for further evaluation of UTIs.  She was referred back in February but finally presents today. She believes she currently has a UTI, with symptoms starting over the weekend.  She is a documented urinary tract infection from January and subsequent infection in February.  These were both obtained at urgent care.  On both occasions, she grew pansensitive E. coli, 50-100 K.  More recently, she was seen in May 14, 2027 again at urgent care with urinary symptoms.  Her urine on this occasion was significantly more bland appearing her urine culture was unremarkable.   She reports consistent symptoms with her supposed UTIs including lower abdominal cramping, vaginal burning, and the sensation of pressure on her bladder when she stands. These symptoms do not improve with voiding.  She reports intermittent constipation and a history of an ovarian cyst in 2012. She says she did not have a history of recurrent UTI prior to this year.  She states she does believe these symptoms correspond with sexual activity. She had one episode of dyspareunia this past weekend. She has been using toys recently, but tries to keep them clean between uses and denies the use of anal toys.  She has been taking Azo but does not believe it is alleviating her symptoms.  She has a Mirena IUD which was placed in 04/2019.  She is had this for 5 years prior to this most recent exchange.  She is not menstruating with regular frequency.   PMH: Past Medical History:  Diagnosis Date  . Ovarian cyst 2012   No surgery needed    Surgical History: Past Surgical History:  Procedure Laterality Date  . NO PAST SURGERIES      Home Medications:  Allergies as of 06/06/2019    Reactions   Other Hives   Other reaction(s): Eczema      Medication List       Accurate as of June 06, 2019 11:11 AM. If you have any questions, ask your nurse or doctor.        AZO URINARY TRACT DEFENSE PO Take by mouth.   escitalopram 20 MG tablet Commonly known as: LEXAPRO TAKE 1 TABLET BY MOUTH  DAILY   levonorgestrel 20 MCG/24HR IUD Commonly known as: MIRENA 1 each by Intrauterine route once.   SUMAtriptan 25 MG tablet Commonly known as: IMITREX Take 1 tablet (25 mg total) by mouth every 2 (two) hours as needed for migraine. No more than 200mg  /24 hr   triamcinolone cream 0.5 % Commonly known as: KENALOG Apply topically 2 (two) times daily.       Allergies:  Allergies  Allergen Reactions  . Other Hives    Other reaction(s): Eczema    Family History: Family History  Problem Relation Age of Onset  . Healthy Mother   . Healthy Father   . Healthy Sister   . Healthy Sister   . Cancer Maternal Grandmother   . Alzheimer's disease Paternal Grandmother   . CVA Paternal Grandfather     Social History:  reports that she has never smoked. She has never used smokeless tobacco. She reports current alcohol use. She reports that she does not use drugs.  ROS: UROLOGY Frequent Urination?: Yes Hard to postpone urination?: Yes Burning/pain with urination?: Yes  Get up at night to urinate?: Yes Leakage of urine?: No Urine stream starts and stops?: No Trouble starting stream?: No Do you have to strain to urinate?: No Blood in urine?: No Urinary tract infection?: Yes Sexually transmitted disease?: No Injury to kidneys or bladder?: No Painful intercourse?: Yes Weak stream?: No Currently pregnant?: No Vaginal bleeding?: No Last menstrual period?: n  Gastrointestinal Nausea?: No Vomiting?: No Indigestion/heartburn?: No Diarrhea?: No Constipation?: No  Constitutional Fever: No Night sweats?: No Weight loss?: No Fatigue?: No  Skin Skin rash/lesions?:  No Itching?: No  Eyes Blurred vision?: No Double vision?: No  Ears/Nose/Throat Sore throat?: No Sinus problems?: No  Hematologic/Lymphatic Swollen glands?: No Easy bruising?: No  Cardiovascular Leg swelling?: No Chest pain?: No  Respiratory Cough?: No Shortness of breath?: No  Endocrine Excessive thirst?: No  Musculoskeletal Back pain?: No Joint pain?: No  Neurological Headaches?: No Dizziness?: No  Psychologic Depression?: Yes Anxiety?: Yes  Physical Exam: BP 120/78   Pulse 82   Ht 5\' 4"  (1.626 m)   Wt 141 lb (64 kg)   BMI 24.20 kg/m   Constitutional:  Alert and oriented, No acute distress. HEENT: [place] AT.  Trachea midline, no masses. Cardiovascular: No clubbing, cyanosis, or edema. Respiratory: Normal respiratory effort, no increased work of breathing. GI: Abdomen is soft, nondistended.  Tenderness to deep palpation in the left lower quadrant as well as midline suprapubic area without rebound but with voluntary guarding. Skin: No rashes, bruises or suspicious lesions. Neurologic: Grossly intact, no focal deficits, moving all 4 extremities. Psychiatric: Normal mood and affect.  Laboratory Data: Lab Results  Component Value Date   WBC 8.6 08/07/2018   HGB 12.4 08/07/2018   HCT 38.0 08/07/2018   MCV 93 08/07/2018   PLT 381 08/07/2018    Lab Results  Component Value Date   CREATININE 0.94 08/07/2018    Lab Results  Component Value Date   HGBA1C 5.0 08/07/2018    Urinalysis UA today reviewed, difficult to interpret given use of Pyridium, 6-10 white blood cells per high-power field otherwise unremarkable on microscopic exam  Pertinent Imaging: Results for orders placed or performed in visit on 06/06/19  Bladder Scan (Post Void Residual) in office  Result Value Ref Range   Scan Result 40ml     Assessment & Plan:    1. Dysuria Patient with a history of two documented UTIs this year but with atypical symptoms including postural bladder  pressure and dyspareunia. I explained to her that while her symptoms may be UTI related, there are other bladder conditions that may better explain her clinical presentation including bladder pain syndrome or ovarian cyst.   I asked her to follow up with [place] over the next year when she believes she is having a UTI so she can provide [place] with a urine sample for culture. I would like to begin documenting her pattern of growth to determine if these are true UTIs or if we should pursue alternative diagnoses.  I will start her on postcoital prophylaxis for the next year today. I also recommended that she initiate probiotic therapy with lactobacillus and cranberry supplementation. I am also prescribing Uribel, as I suspect its anti-spasmodic properties to be used prn.  Will send urine for culture today and defer treatment for possible current UTI pending these results. If culture is negative, I advised her to follow up with her PCP in case they would like to proceed with workup for alternative non-urological diagnoses.  Debroah Loop, PA-C  [facility] [address], [address] [place], [place] [id number] [phone]  I have seen and examined the patient, labs/ imaging reviewed and discussed  management with Hilton SinclairSam Vaillancourt.  I reviewed the PA's note and agree with the documented findings and plan of care.

## 2019-06-08 ENCOUNTER — Telehealth: Payer: Self-pay

## 2019-06-08 LAB — CULTURE, URINE COMPREHENSIVE

## 2019-06-08 MED ORDER — SULFAMETHOXAZOLE-TRIMETHOPRIM 800-160 MG PO TABS: 1.0000 | ORAL_TABLET | Freq: Two times a day (BID) | ORAL | 0 refills | Status: DC

## 2019-06-08 NOTE — Telephone Encounter (Signed)
-----   Message from Hollice Espy, MD sent at 06/08/2019 10:10 AM EDT ----- Urine is growing low colony count of E. coli.  Sensitivities are not back yet but given that were going into the weekend, with lets start treating her with Bactrim bid x 7 days.  Hollice Espy, MD

## 2019-06-08 NOTE — Telephone Encounter (Signed)
Patient called to make sure we knew to send her ABX into the [facility] on [address]. I told her to give [place] to the end of the day to get that called in for her.   Thanks, Sharyn Lull

## 2019-06-08 NOTE — Telephone Encounter (Signed)
Script sent  

## 2019-06-13 ENCOUNTER — Telehealth: Payer: Self-pay | Admitting: *Deleted

## 2019-06-13 NOTE — Telephone Encounter (Signed)
Left VM to return call regarding RX

## 2019-07-18 ENCOUNTER — Ambulatory Visit: Payer: Self-pay | Admitting: Physician Assistant

## 2019-08-23 ENCOUNTER — Other Ambulatory Visit: Payer: Self-pay | Admitting: Physician Assistant

## 2019-08-23 DIAGNOSIS — F3281 Premenstrual dysphoric disorder: Secondary | ICD-10-CM

## 2019-11-12 ENCOUNTER — Encounter: Payer: Self-pay | Admitting: Physician Assistant

## 2020-06-02 ENCOUNTER — Other Ambulatory Visit: Payer: Self-pay | Admitting: Physician Assistant

## 2020-06-02 DIAGNOSIS — F3281 Premenstrual dysphoric disorder: Secondary | ICD-10-CM

## 2020-06-02 MED ORDER — ESCITALOPRAM OXALATE 20 MG PO TABS: 20.0000 mg | ORAL_TABLET | Freq: Every day | ORAL | 3 refills | Status: AC

## 2020-06-02 NOTE — Telephone Encounter (Signed)
[facility] faxed refill request for the following medications:  escitalopram (LEXAPRO) 20 MG tablet   Please advise.

## 2021-08-17 ENCOUNTER — Other Ambulatory Visit: Payer: Self-pay | Admitting: Physician Assistant

## 2021-08-17 DIAGNOSIS — F3281 Premenstrual dysphoric disorder: Secondary | ICD-10-CM
